# Patient Record
Sex: Female | Born: 1975 | Race: Black or African American | Hispanic: No | Marital: Married | Smoking: Never smoker
Health system: Southern US, Community
[De-identification: ages and names within clinical notes are randomized; demographics above are authoritative.]

## PROBLEM LIST (undated history)

## (undated) DIAGNOSIS — I10 Essential (primary) hypertension: Secondary | ICD-10-CM

## (undated) DIAGNOSIS — G43909 Migraine, unspecified, not intractable, without status migrainosus: Secondary | ICD-10-CM

## (undated) DIAGNOSIS — E119 Type 2 diabetes mellitus without complications: Secondary | ICD-10-CM

## (undated) DIAGNOSIS — F319 Bipolar disorder, unspecified: Secondary | ICD-10-CM

## (undated) DIAGNOSIS — D649 Anemia, unspecified: Secondary | ICD-10-CM

## (undated) HISTORY — PX: GASTRIC BYPASS: SHX52

---

## 2000-04-25 ENCOUNTER — Encounter: Admission: RE | Admit: 2000-04-25 | Discharge: 2000-07-24 | Payer: Self-pay | Admitting: Surgical Oncology

## 2002-01-23 ENCOUNTER — Emergency Department (HOSPITAL_COMMUNITY): Admission: EM | Admit: 2002-01-23 | Discharge: 2002-01-23 | Payer: Self-pay | Admitting: Emergency Medicine

## 2003-09-29 ENCOUNTER — Emergency Department (HOSPITAL_COMMUNITY): Admission: EM | Admit: 2003-09-29 | Discharge: 2003-09-29 | Payer: Self-pay | Admitting: Family Medicine

## 2004-04-08 ENCOUNTER — Emergency Department (HOSPITAL_COMMUNITY): Admission: EM | Admit: 2004-04-08 | Discharge: 2004-04-08 | Payer: Self-pay | Admitting: Family Medicine

## 2005-04-12 ENCOUNTER — Inpatient Hospital Stay (HOSPITAL_COMMUNITY): Admission: AD | Admit: 2005-04-12 | Discharge: 2005-04-12 | Payer: Self-pay | Admitting: Obstetrics and Gynecology

## 2005-04-20 ENCOUNTER — Other Ambulatory Visit: Admission: RE | Admit: 2005-04-20 | Discharge: 2005-04-20 | Payer: Self-pay | Admitting: Obstetrics and Gynecology

## 2005-04-21 ENCOUNTER — Other Ambulatory Visit: Admission: RE | Admit: 2005-04-21 | Discharge: 2005-04-21 | Payer: Self-pay | Admitting: Obstetrics and Gynecology

## 2005-07-13 ENCOUNTER — Ambulatory Visit (HOSPITAL_COMMUNITY): Admission: RE | Admit: 2005-07-13 | Discharge: 2005-07-13 | Payer: Self-pay | Admitting: Obstetrics and Gynecology

## 2005-09-06 ENCOUNTER — Ambulatory Visit: Payer: Self-pay | Admitting: Gynecology

## 2005-09-06 ENCOUNTER — Emergency Department (HOSPITAL_COMMUNITY): Admission: EM | Admit: 2005-09-06 | Discharge: 2005-09-06 | Payer: Self-pay | Admitting: Emergency Medicine

## 2005-09-07 ENCOUNTER — Inpatient Hospital Stay (HOSPITAL_COMMUNITY): Admission: RE | Admit: 2005-09-07 | Discharge: 2005-09-09 | Payer: Self-pay | Admitting: Psychiatry

## 2005-11-09 ENCOUNTER — Inpatient Hospital Stay (HOSPITAL_COMMUNITY): Admission: AD | Admit: 2005-11-09 | Discharge: 2005-11-09 | Payer: Self-pay | Admitting: Obstetrics and Gynecology

## 2005-11-10 ENCOUNTER — Observation Stay (HOSPITAL_COMMUNITY): Admission: AD | Admit: 2005-11-10 | Discharge: 2005-11-11 | Payer: Self-pay | Admitting: Obstetrics and Gynecology

## 2005-11-23 ENCOUNTER — Encounter (INDEPENDENT_AMBULATORY_CARE_PROVIDER_SITE_OTHER): Payer: Self-pay | Admitting: Specialist

## 2005-11-23 ENCOUNTER — Inpatient Hospital Stay (HOSPITAL_COMMUNITY): Admission: AD | Admit: 2005-11-23 | Discharge: 2005-11-26 | Payer: Self-pay | Admitting: Obstetrics and Gynecology

## 2005-11-29 ENCOUNTER — Inpatient Hospital Stay (HOSPITAL_COMMUNITY): Admission: AD | Admit: 2005-11-29 | Discharge: 2005-11-29 | Payer: Self-pay | Admitting: Obstetrics & Gynecology

## 2006-05-26 ENCOUNTER — Emergency Department (HOSPITAL_COMMUNITY): Admission: EM | Admit: 2006-05-26 | Discharge: 2006-05-26 | Payer: Self-pay | Admitting: Emergency Medicine

## 2006-11-01 ENCOUNTER — Emergency Department (HOSPITAL_COMMUNITY): Admission: EM | Admit: 2006-11-01 | Discharge: 2006-11-01 | Payer: Self-pay | Admitting: Family Medicine

## 2008-12-24 ENCOUNTER — Emergency Department (HOSPITAL_COMMUNITY): Admission: EM | Admit: 2008-12-24 | Discharge: 2008-12-24 | Payer: Self-pay | Admitting: Emergency Medicine

## 2010-01-26 ENCOUNTER — Emergency Department (HOSPITAL_COMMUNITY): Admission: EM | Admit: 2010-01-26 | Discharge: 2010-01-26 | Payer: Self-pay | Admitting: Emergency Medicine

## 2010-03-10 ENCOUNTER — Emergency Department (HOSPITAL_COMMUNITY): Admission: EM | Admit: 2010-03-10 | Discharge: 2010-03-10 | Payer: Self-pay | Admitting: Family Medicine

## 2010-04-06 ENCOUNTER — Emergency Department (HOSPITAL_COMMUNITY): Admission: EM | Admit: 2010-04-06 | Discharge: 2010-04-06 | Payer: Self-pay | Admitting: Family Medicine

## 2010-04-10 ENCOUNTER — Emergency Department (HOSPITAL_COMMUNITY): Admission: EM | Admit: 2010-04-10 | Discharge: 2010-04-10 | Payer: Self-pay | Admitting: Emergency Medicine

## 2010-10-21 NOTE — Discharge Summary (Signed)
Kathryn Middleton, Kathryn Middleton           ACCOUNT NO.:  [id number]   MEDICAL RECORD NO.:  [id number]          PATIENT TYPE:  IPS   LOCATION:  0505                          FACILITY:  [facility]   PHYSICIAN:  Geoffery Lyons, M.D.      DATE OF BIRTH:  [birth year]   DATE OF ADMISSION:  09/07/2005  DATE OF DISCHARGE:  09/09/2005                                 DISCHARGE SUMMARY   CHIEF COMPLAINT PRESENT ILLNESS:  This was the first admission to [facility] for this 35 year old African American female  __________ admitted.  Referred by OBG, presented in the emergency room with  confusion, some unresponsiveness, wbc 18.6 but no signs of infection.  Admitted to insomnia, taking one and a half tablets of Ambien daily and  possibly Benadryl.  When home was guarded, felt paranoid, was feeling that  someone would break into the house.  Had been taking one and a half Ambien  for sleep and Benadryl for allergies.   PAST PSYCHIATRIC HISTORY:  Has seen Valinda Hoar, N.P., and Areta Haber  first time inpatient.  Treated outpatient by Valinda Hoar, N.P., for  anxiety.  History of variable sleep and mood since the 20s.  No true mania.   ALCOHOL AND DRUG HISTORY:  Denies abuse of any substances.   PAST MEDICAL HISTORY:  1.  Seven and a half months pregnant.  2.  Gastric bypass 2002.  3.  Gestational diabetes.  4.  Cesarean section x1.   MEDICATIONS:  1.  Benadryl 25 one to two p.r.n. allergies.  2.  Celexa 40 mg per day.  3.  Hydrochlorothiazide 12.5 mg per day.  4.  Methyldopa oral three twice a day.   Physical examination performed and failed to show any acute findings other  than the stated pregnancy.   LABORATORY DATA:  CBC with wbc 19.2, hemoglobin 11.3.   MENTAL STATUS EXAM:  Stable __________ alert, cooperative female.  Mood  somewhat anxious.  Affect bright, broad.  Speech fluent, articulate, normal  rate and production.  Mood euthymic.  Affect broad.  Thought  process  logical, coherent and relevant.  No evidence of delusions.  No  hallucinations.  Cognition well preserved.   ADMISSION DIAGNOSES:  AXIS I:  Rule out organic brain syndrome secondary to  medications.  Mood disorder, not otherwise specified.  AXIS II:  No diagnosis.  AXIS III:  Intrauterine pregnancy, seven months' gestation.  Arterial  hypertension.  Leukocytosis.  AXIS IV:  Moderate.  AXIS V:  Upon admission 35, highest GAF in the last year 75.   HOSPITAL COURSE:  She was admitted.  She was started in individual and group  psychotherapy.  She was initially maintained on the Celexa 30 mg per day,  methyldopa 250 twice a day, Niferex 150 mg daily, hydrochlorothiazide 12.5  mg per day.  Celexa was dropped to 10 mg per day.  She was given some Ambien  for sleep.  She did endorse that she got overwhelmed, not being able to  sleep.  She took more Ambien than recommended, got very upset with herself,  endorsed some underlying  depression, isolating.  Has let go of the  activities that she use to like to do, church, husband being a Education officer, environmental, but  she said that she was not suicidal and that she was feeling well and that  she wanted to be discharged home.  As there were no safety concerns, she was  in full contact with reality, there was no evidence of suicidal or homicidal  ideations and most possibly the acute presentation was secondary to  medication, we went ahead and discharged to outpatient follow-up.   DISCHARGE DIAGNOSES:  AXIS I:  Depressive disorder, not otherwise specified.  Medication induced psychotic symptoms, resolved.  AXIS II:  No diagnosis.  AXIS III:  Intrauterine pregnancy.  Arterial hypertension.  Leukocytosis,  cause undetermined.  AXIS IV:  Moderate.  AXIS V:  Upon discharge 55 to 60.   DISCHARGE MEDICATIONS:  1.  __________ one tablet daily.  2.  Aldomet 250 twice a day.  3.  Nu-Iron 150 daily.  4.  Hydrochlorothiazide 25 mg per day.  5.  Celexa 20 one half  daily.  6.  Ambien 10 one half to one at night for sleep.   FOLLOW UP:  Miguel Aschoff, M.D., within a week.  They are going to coordinate  any further psychiatric follow-up.      Geoffery Lyons, M.D.  Electronically Signed     IL/MEDQ  D:  09/19/2005  T:  09/20/2005  Job:  [id number]

## 2010-10-21 NOTE — Discharge Summary (Signed)
NAMEVERONNICA, Kathryn Middleton           ACCOUNT NO.:  [id number]   MEDICAL RECORD NO.:  [id number]          PATIENT TYPE:  INP   LOCATION:  9171                          FACILITY:  [facility]   PHYSICIAN:  Miguel Aschoff, M.D.       DATE OF BIRTH:  1975/10/04   DATE OF ADMISSION:  11/10/2005  DATE OF DISCHARGE:  11/11/2005                                 DISCHARGE SUMMARY   FINAL DIAGNOSIS:  Intrauterine pregnancy at 36 weeks and 2 days, chronic  hypertension which is stable on Aldomet IV and insulin dependent gestational  diabetes mellitus and back pain with cholelithiasis.   COMPLICATIONS:  None.   This 35 year old G2 P1-0-0-1 presents at [redacted] weeks gestation complaining of  upper and lower back pain and some decreased fetal movement.  The patient  has also had some nausea and vomiting starting and questionable  contractions.  Her antepartum course at this point has been complicated by  chronic hypertension on which she was stable on Aldomet IV.  She has also  had a history of gastric bypass surgery.  She is also on some Celexa 40 mg  daily and insulin for her gestational diabetes mellitus.  The patient was  admitted at this time. Her cervix was still closed.  The fetal heart tones  were in the 160s to 180s.  She did have a reactive strep.  She was admitted  for observation.  She had a BPP on June 7 that was 8/10 with a gallbladder  ultrasound was ordered to check if this is her source of pain. It  that did  come back showing, showing cholelithiasis but no evidence of acute  cholecystitis.  She was sent home on home on her diabetic diet to continue  her medications she is taking currently, given Darvocet N 100 one to two  every 4 hours as needed for pain, was to follow up in our office on June 12,  of course to call with any increased pain or problems.   LABS ON DISCHARGE:  The patient had a hemoglobin of 11.5, white blood cell  count of 9.2, platelets of 289,000.  The patient has also had low  sodium and  calcium upon admission on the 8th but no abnormalities in her PIH panel.      Leilani Able, P.A.-C.      Miguel Aschoff, M.D.  Electronically Signed    MB/MEDQ  D:  01/01/2006  T:  01/01/2006  Job:  [id number]

## 2010-10-21 NOTE — Discharge Summary (Signed)
Kathryn Middleton, Kathryn Middleton           ACCOUNT NO.:  [id number]   MEDICAL RECORD NO.:  [id number]          PATIENT TYPE:  INP   LOCATION:                                [facility]  [facility]   PHYSICIAN:  Ilda Mori, M.D.   DATE OF BIRTH:  15-Jan-1976   DATE OF ADMISSION:  11/23/2005  DATE OF DISCHARGE:  11/26/2005                                 DISCHARGE SUMMARY   FINAL DIAGNOSES:  1.  Intrauterine pregnancy at [redacted] weeks gestation, breech presentation.  2.  Chronic hypertension.  3.  Insulin dependent gestational diabetes mellitus.  4.  Depression.   PROCEDURE:  Repeat low flap transverse cesarean section. Surgeon Dr. Miguel Aschoff. Assistant Dr. Lodema Hong.   COMPLICATIONS:  None.   This 35 year old G2, P1-0-0-1, presents at [redacted] weeks gestation for repeat  cesarean section. The patient had a prior cesarean section with her last  pregnancy and desires repeat with this pregnancy.  The patient also has a  known breech presentation. The patient's antepartum course has been  complicated by chronic hypertension. Her blood pressures have been stable on  Aldomet orally. The patient also has gestational diabetes which she has used  insulin for and has had pretty good blood sugar control. The patient also  noted to have a positive group B strep culture at [redacted] weeks gestation in our  office. The patient has also been previously hospitalized a week or two  prior for back pain which was recognized to be cholelithiasis. The patient  presents to [facility] on November 23, 2005 for a repeat low transverse  cesarean section performed by Dr. Miguel Aschoff. The patient had a delivery of  a 6 pound 6 ounce female infant with Apgars of 9 and 9. There was a breech  presentation upon delivery. Delivery went without complications. The  patient's postoperative course was benign without any significant fevers.  She was felt ready for discharge on postoperative day #3. She was sent home  on a regular diet, told to  decrease her activities. Told to get back on her  lisinopril for blood pressure 10 mg daily, to continue her Celexa 40 mg  daily for her depression. Could continue her Ambien 10 mg as needed to help  sleep. Was given Vicodin #30, one every 6 hours as needed for pain. Told she  could use Motrin up to 800 mg every 6 hours as needed for pain. To continue  her vitamins. Was to follow up in our office on June 26 for her staple  removal, and, of course, to call with any increased bleeding, pain or  problems.   LABORATORIES ON DISCHARGE:  The patient had a hemoglobin 9.3, white blood  cell count 14.9, platelets 285,000, and a normal PIH panel.     Leilani Able, P.A.-C.      Ilda Mori, M.D.  Electronically Signed   MB/MEDQ  D:  01/01/2006  T:  01/01/2006  Job:  [id number]

## 2010-10-21 NOTE — Op Note (Signed)
Kathryn Middleton, Kathryn Middleton           ACCOUNT NO.:  [id number]   MEDICAL RECORD NO.:  [id number]          PATIENT TYPE:  INP   LOCATION:  9131                          FACILITY:  [facility]   PHYSICIAN:  Miguel Aschoff, M.D.       DATE OF BIRTH:  [birth year]   DATE OF PROCEDURE:  11/23/2005  DATE OF DISCHARGE:                                 OPERATIVE REPORT   PREOPERATIVE DIAGNOSES:  1.  Intrauterine pregnancy at 38 weeks, for elective repeat cesarean      section.  2.  Breech presentation.   POSTOPERATIVE DIAGNOSES:  1.  Intrauterine pregnancy at 38 weeks, for elective repeat cesarean      section.  2.  Breech presentation.  3.  Delivery of a viable female infant, Apgar 9/9.   PROCEDURE:  Repeat low flap transverse cesarean section.   SURGEON:  Miguel Aschoff, M.D.   ASSISTANT:  Luvenia Redden, M.D.   ANESTHESIA:  Spinal.   COMPLICATIONS:  None.   JUSTIFICATION:  The patient is a 35 year old black female with an estimated  date of confinement of December 07, 2005.  The patient underwent previous  cesarean section.  The patient's pregnancy has been complicated by  hypertension and diabetes.  She is noted to have a breech presentation and  in view of the patient's additional medical problems, she is being taken to  the operating room at this time to undergo elective repeat cesarean section.   PROCEDURE:  The patient was taken to the operating room and placed in a  sitting position, where combined spinal and epidural anesthetic were placed  without difficulty.  After this was done a Foley catheter was inserted and  the patient was prepped and draped in the usual sterile fashion.  Her  previous Pfannenstiel incision was then reincised.  The incision was  extended down through subcutaneous tissue with bleeding points being  clamped, cut and coagulated as they were encountered.  The fascia was then  incised transversely and then separated from the underlying rectus muscles.  The rectus muscles were  divided in the midline.  The peritoneum was then  found and entered, carefully avoiding the underlying structures.  At this  point a bladder flap was created and protected with a bladder blade.  Then  elliptical incision was made into the lower uterine segment.  The amniotic  cavity was entered, clear fluid was obtained, and at this point the patient  was delivered of a viable female infant, Apgar 9 at one minute and 9 at five  minutes, from a double footling left sacrum anterior breech position.  The  baby was handed to the pediatric team in attendance.  After this was done  cord bloods were obtained for appropriate studies.  The uterus was then  evacuated of any remaining products of conception.  The angles of the  uterine incision were then ligated using figure-of-eight sutures of #1  Vicryl.  There was an extension on the left uterine incision that was  repaired without difficulty.  The uterus was then closed in layers.  The  first layer was a running interlocking  suture of #1 Vicryl, followed by an  imbricating suture of #1 Vicryl.  The bladder flap was then reapproximated  using running continuous 2-0 Vicryl sutures.  At this point lap counts and  instrument counts were taken and found to be correct, and then the abdomen  was closed.  The parietal peritoneum was closed using running continuous 0  Vicryl suture.  Rectus muscles were reapproximated using running continuous  0 Vicryl sutures.  Fascia was then closed using 2 sutures of 0 Vicryl, each  starting at the lateral fascial angles and then meeting in the midline.  The  subcutaneous tissue was closed using interrupted 0 Vicryl sutures and the  skin incision was closed using staples.  The estimated blood loss from the  procedure was approximately 600 mL.  The patient tolerated the procedure  well and went to the recovery room in satisfactory condition.      Miguel Aschoff, M.D.  Electronically Signed     AR/MEDQ  D:  11/23/2005   T:  11/24/2005  Job:  [id number]

## 2011-10-05 ENCOUNTER — Emergency Department (INDEPENDENT_AMBULATORY_CARE_PROVIDER_SITE_OTHER)
Admission: EM | Admit: 2011-10-05 | Discharge: 2011-10-05 | Disposition: A | Discharge status: Home / Self Care | Payer: Self-pay | Source: Home / Self Care | Attending: Family Medicine | Admitting: Family Medicine

## 2011-10-05 ENCOUNTER — Encounter (HOSPITAL_COMMUNITY): Payer: Self-pay | Admitting: Emergency Medicine

## 2011-10-05 DIAGNOSIS — J069 Acute upper respiratory infection, unspecified: Secondary | ICD-10-CM

## 2011-10-05 HISTORY — DX: Essential (primary) hypertension: I10

## 2011-10-05 MED ORDER — IBUPROFEN 800 MG PO TABS: 800.0000 mg | ORAL_TABLET | Freq: Three times a day (TID) | ORAL | Status: AC

## 2011-10-05 MED ORDER — GUAIFENESIN-CODEINE 100-10 MG/5ML PO SYRP: ORAL_SOLUTION | ORAL | Status: DC

## 2011-10-05 MED ORDER — AMOXICILLIN 500 MG PO CAPS: 500.0000 mg | ORAL_CAPSULE | Freq: Three times a day (TID) | ORAL | Status: AC

## 2011-10-05 NOTE — ED Notes (Signed)
Great discussion about medicines.  Patient requesting ibuprofen 800mg  script, contacted dr Lorenza Chick

## 2011-10-05 NOTE — ED Notes (Signed)
Throat pain with coughing.  C/o congestion.  "if i just could get it up".  Reports coughing" fits ".  Cough worse at night.  Reports fever 101.

## 2011-10-05 NOTE — ED Provider Notes (Signed)
History     CSN: [id number]  Arrival date & time 10/05/11  0903   First MD Initiated Contact with Patient 10/05/11 [phone]      Chief Complaint  Patient presents with  . Sore Throat    (Consider location/radiation/quality/duration/timing/severity/associated sxs/prior treatment) HPI Comments: The patient reports having cough and sore throat x 2-3 days. Fever to 101 2 days ago. Taking otc meds with minimal relief. Coughing causing her chest to hurt. No ear pain. Some runny nose.   The history is provided by the patient.    Past Medical History  Diagnosis Date  . Hypertension     Past Surgical History  Procedure Date  . Cesarean section     History reviewed. No pertinent family history.  History  Substance Use Topics  . Smoking status: Never Smoker   . Smokeless tobacco: Not on file  . Alcohol Use: No    OB History    Grav Para Term Preterm Abortions TAB SAB Ect Mult Living                  Review of Systems  Constitutional: Positive for fever and chills.  HENT: Positive for congestion, sore throat, rhinorrhea and postnasal drip. Negative for ear pain.   Respiratory: Positive for cough. Negative for wheezing.   Cardiovascular: Negative.   Gastrointestinal: Negative.   Musculoskeletal: Positive for arthralgias.  Skin: Negative.     Allergies  Review of patient's allergies indicates no known allergies.  Home Medications   Current Outpatient Rx  Name Route Sig Dispense Refill  . LISINOPRIL-HYDROCHLOROTHIAZIDE 20-25 MG PO TABS Oral Take 1 tablet by mouth daily.    Marland Kitchen OVER THE COUNTER MEDICATION  Delsym, robitussin, allergy/sinus medicine      BP 116/82  Pulse 110  Temp(Src) 98.5 F (36.9 C) (Oral)  Resp 18  SpO2 95%  Physical Exam  Nursing note and vitals reviewed. Constitutional: She appears well-developed and well-nourished. No distress.  HENT:  Head: Normocephalic.       Ears clear, nose congested, throat mild erythema.   Neck: Normal range of  motion. Neck supple.  Cardiovascular: Normal rate and regular rhythm.   Pulmonary/Chest: Effort normal and breath sounds normal.       Congested croupy cough  Lymphadenopathy:    She has no cervical adenopathy.  Skin: Skin is warm and dry.    ED Course  Procedures (including critical care time)  Labs Reviewed - No data to display No results found.   1. URI (upper respiratory infection)       MDM          Randa Spike, MD 10/05/11 1100

## 2011-10-05 NOTE — Discharge Instructions (Signed)
Tylenol or motrin as needed. Void caffeine and milk products. Follow up with your pcp or return if symptoms persist or worsen.

## 2011-12-20 ENCOUNTER — Emergency Department (HOSPITAL_COMMUNITY)
Admission: EM | Admit: 2011-12-20 | Discharge: 2011-12-20 | Disposition: A | Discharge status: Home / Self Care | Payer: Self-pay | Attending: Emergency Medicine | Admitting: Emergency Medicine

## 2011-12-20 ENCOUNTER — Encounter (HOSPITAL_COMMUNITY): Payer: Self-pay

## 2011-12-20 DIAGNOSIS — I10 Essential (primary) hypertension: Secondary | ICD-10-CM | POA: Insufficient documentation

## 2011-12-20 DIAGNOSIS — K029 Dental caries, unspecified: Secondary | ICD-10-CM | POA: Insufficient documentation

## 2011-12-20 DIAGNOSIS — K0889 Other specified disorders of teeth and supporting structures: Secondary | ICD-10-CM

## 2011-12-20 MED ORDER — IBUPROFEN 800 MG PO TABS: 800.0000 mg | ORAL_TABLET | Freq: Three times a day (TID) | ORAL | Status: AC

## 2011-12-20 MED ORDER — PENICILLIN V POTASSIUM 500 MG PO TABS: 500.0000 mg | ORAL_TABLET | Freq: Three times a day (TID) | ORAL | Status: AC

## 2011-12-20 MED ORDER — ACETAMINOPHEN-CODEINE #3 300-30 MG PO TABS: 1.0000 | ORAL_TABLET | Freq: Four times a day (QID) | ORAL | Status: AC | PRN

## 2011-12-20 NOTE — ED Notes (Signed)
Patient reports that she has dental pain right upper tooth x 2 weeks. Patient denies swelling of area.

## 2011-12-20 NOTE — ED Provider Notes (Signed)
History     CSN: [id number]  Arrival date & time 12/20/11  1032   First MD Initiated Contact with Patient 12/20/11 1117      Chief Complaint  Patient presents with  . Dental Pain    (Consider location/radiation/quality/duration/timing/severity/associated sxs/prior treatment) Patient is a 36 y.o. female presenting with tooth pain. The history is provided by the patient.  Dental Pain  Patient presents to emergency department complaining of right upper dental pain x2 weeks with associated dental decay. Patient states pain was gradual onset but has been persistent and worsening despite using over-the-counter pain medications as well as topical Orajel. Patient states the pain is associated with dental decay stating that she can "feel a hole in my tooth with my tongue" patient states that she has not had dental followup and a very long time due to financial difficulties. Patient denies fevers, chills, facial swelling, difficulty breathing or swallowing. Patient is pain is aggravated by chewing and cold air. She denies alleviating factors. Past Medical History  Diagnosis Date  . Hypertension     Past Surgical History  Procedure Date  . Cesarean section     No family history on file.  History  Substance Use Topics  . Smoking status: Never Smoker   . Smokeless tobacco: Not on file  . Alcohol Use: No    OB History    Grav Para Term Preterm Abortions TAB SAB Ect Mult Living                  Review of Systems  All other systems reviewed and are negative.    Allergies  Review of patient's allergies indicates no known allergies.  Home Medications   Current Outpatient Rx  Name Route Sig Dispense Refill  . ASPIRIN-ACETAMINOPHEN-CAFFEINE 250-250-65 MG PO TABS Oral Take 2 tablets by mouth every 6 (six) hours as needed. For migraine    . BENZOCAINE 10 % MT GEL Mouth/Throat Use as directed 1 application in the mouth or throat 3 (three) times daily as needed. For pain    .  LISINOPRIL-HYDROCHLOROTHIAZIDE 20-25 MG PO TABS Oral Take 1 tablet by mouth daily.    Marland Kitchen OXYMETAZOLINE HCL 0.05 % NA SOLN Nasal Place 3 sprays into the nose daily as needed. For sinus pressure    . ACETAMINOPHEN-CODEINE #3 300-30 MG PO TABS Oral Take 1-2 tablets by mouth every 6 (six) hours as needed for pain. 20 tablet 0  . IBUPROFEN 800 MG PO TABS Oral Take 1 tablet (800 mg total) by mouth 3 (three) times daily. Take 800mg  by mouth at breakfast, lunch and dinner for the next 5 days 21 tablet 0  . PENICILLIN V POTASSIUM 500 MG PO TABS Oral Take 1 tablet (500 mg total) by mouth 3 (three) times daily. 30 tablet 0    BP 90/68  Pulse 75  Temp 98.6 F (37 C)  Resp 20  SpO2 97%  LMP 12/13/2011  Physical Exam  Nursing note and vitals reviewed. Constitutional: She is oriented to person, place, and time. She appears well-developed and well-nourished.  HENT:  Head: Normocephalic and atraumatic.       Massive dental decay of right upper molar with TTP of surrounding gingiva but no gingival mass or fluctuance.   Eyes: Conjunctivae are normal.  Neck: Normal range of motion. Neck supple.  Cardiovascular: Normal rate and regular rhythm.   Pulmonary/Chest: Effort normal and breath sounds normal.  Musculoskeletal: Normal range of motion.  Lymphadenopathy:    She has no  cervical adenopathy.  Neurological: She is alert and oriented to person, place, and time.  Skin: Skin is warm and dry.  Psychiatric: She has a normal mood and affect. Her behavior is normal.    ED Course  Procedures (including critical care time)  Patient is driving. Happy with prescriptions for pain control.   Labs Reviewed - No data to display No results found.   1. Pain, dental   2. Dental caries       MDM  Dental pain associated with dental cary but no signs or symptoms of dental abscess with patient afebrile, non toxic appearing and swallowing secretions well. I gave patient referral to dentist and stressed the  importance of dental follow up for ultimate management of dental pain. Patient voices understanding and is agreeable to plan.         [place], [place] 12/20/11 1229

## 2011-12-21 NOTE — ED Provider Notes (Signed)
Medical screening examination/treatment/procedure(s) were performed by non-physician practitioner and as supervising physician I was immediately available for consultation/collaboration.   Kairyn Olmeda R Celinda Dethlefs, MD 12/21/11 0749 

## 2012-06-09 ENCOUNTER — Encounter (HOSPITAL_COMMUNITY): Payer: Self-pay | Admitting: Emergency Medicine

## 2012-06-09 ENCOUNTER — Emergency Department (HOSPITAL_COMMUNITY)
Admission: EM | Admit: 2012-06-09 | Discharge: 2012-06-09 | Disposition: A | Discharge status: Home / Self Care | Payer: Self-pay | Source: Home / Self Care

## 2012-06-09 ENCOUNTER — Emergency Department (INDEPENDENT_AMBULATORY_CARE_PROVIDER_SITE_OTHER): Payer: Self-pay

## 2012-06-09 DIAGNOSIS — J069 Acute upper respiratory infection, unspecified: Secondary | ICD-10-CM

## 2012-06-09 MED ORDER — AZITHROMYCIN 250 MG PO TABS: ORAL_TABLET | ORAL | Status: DC

## 2012-06-09 MED ORDER — GUAIFENESIN-CODEINE 100-10 MG/5ML PO SYRP: 5.0000 mL | ORAL_SOLUTION | Freq: Three times a day (TID) | ORAL | Status: DC | PRN

## 2012-06-09 NOTE — ED Notes (Signed)
Waiting discharge papers 

## 2012-06-09 NOTE — ED Notes (Signed)
Pt c/o productive cough x 2 to three days. Sob. Headache. Fever. Pt has tried otc meds with no relief of symptoms. Pt denies vomiting and diarrhea. Some nausea.

## 2012-06-09 NOTE — ED Provider Notes (Signed)
Medical screening examination/treatment/procedure(s) were performed by non-physician practitioner and as supervising physician I was immediately available for consultation/collaboration.  Raynald Blend, MD 06/09/12 1745

## 2012-06-09 NOTE — ED Provider Notes (Addendum)
Kathryn Middleton is a 37 y.o. female who presents to Urgent Care today for cough, and mild fever present for 3 days. Patient notes trouble sleeping at night secondary to cough. She denies any shortness of breath chest pain or palpitation. She denies any chills. She has tried over-the-counter medications which are only marginally helpful. She feels well otherwise with no nausea vomiting or diarrhea. She has positive sick contacts.   PMH reviewed. Hypertension, and obesity and anxiety History  Substance Use Topics  . Smoking status: Never Smoker   . Smokeless tobacco: Not on file  . Alcohol Use: No   ROS as above Medications reviewed. No current facility-administered medications for this encounter.   Current Outpatient Prescriptions  Medication Sig Dispense Refill  . BuPROPion HCl (WELLBUTRIN PO) Take by mouth.      Marland Kitchen lisinopril-hydrochlorothiazide (PRINZIDE,ZESTORETIC) 20-25 MG per tablet Take 1 tablet by mouth daily.      Marland Kitchen aspirin-acetaminophen-caffeine (EXCEDRIN MIGRAINE) 250-250-65 MG per tablet Take 2 tablets by mouth every 6 (six) hours as needed. For migraine      . benzocaine (ORAJEL) 10 % mucosal gel Use as directed 1 application in the mouth or throat 3 (three) times daily as needed. For pain      . oxymetazoline (AFRIN) 0.05 % nasal spray Place 3 sprays into the nose daily as needed. For sinus pressure        Exam:  BP 137/72  Pulse 116  Temp 98.3 F (36.8 C) (Oral)  Resp 20  SpO2 96% Gen: Well NAD, nontoxic appearing HEENT: EOMI,  MMM Lungs: CTABL Nl WOB Heart: RRR no MRG Abd: NABS, NT, ND Exts: Non edematous BL  LE, warm and well perfused.   No results found for this or any previous visit (from the past 24 hour(s)). Dg Chest 2 View  06/09/2012  *RADIOLOGY REPORT*  Clinical Data: Tachycardia.  Hypoxemia.  CHEST - 2 VIEW  Comparison: Two-view chest x-ray 11/01/2006.  Findings: Cardiomediastinal silhouette unremarkable, unchanged. Lungs clear.  Bronchovascular markings  normal.  Pulmonary vascularity normal.  No pneumothorax.  No pleural effusions. Visualized bony thorax intact.  IMPRESSION: Normal examination.   Original Report Authenticated By: Hulan Saas, M.D.     Assessment and Plan: 37 y.o. female with viral URI with cough. Plan to treat symptomatically with codeine containing cough medication, and Tylenol. Discussed warning signs or symptoms. Please see discharge instructions. Patient expresses understanding. F/u PRN  We'll prescribe azithromycin to fill on Wednesday if patient is not feeling better.    Rodolph Bong, MD 06/09/12 1721  Rodolph Bong, MD 06/09/12 [phone]

## 2012-06-09 NOTE — ED Provider Notes (Signed)
Medical screening examination/treatment/procedure(s) were performed by non-physician practitioner and as supervising physician I was immediately available for consultation/collaboration.  Raynald Blend, MD 06/09/12 [phone]

## 2013-08-08 ENCOUNTER — Encounter (HOSPITAL_COMMUNITY): Payer: Self-pay | Admitting: Emergency Medicine

## 2013-08-08 ENCOUNTER — Emergency Department (HOSPITAL_COMMUNITY)
Admission: EM | Admit: 2013-08-08 | Discharge: 2013-08-08 | Disposition: A | Discharge status: Home / Self Care | Payer: BC Managed Care – PPO | Source: Home / Self Care | Attending: Family Medicine | Admitting: Family Medicine

## 2013-08-08 ENCOUNTER — Emergency Department (INDEPENDENT_AMBULATORY_CARE_PROVIDER_SITE_OTHER): Payer: BC Managed Care – PPO

## 2013-08-08 DIAGNOSIS — R6889 Other general symptoms and signs: Secondary | ICD-10-CM

## 2013-08-08 MED ORDER — KETOROLAC TROMETHAMINE 60 MG/2ML IM SOLN
INTRAMUSCULAR | Status: AC
  Filled 2013-08-08: qty 2

## 2013-08-08 MED ORDER — KETOROLAC TROMETHAMINE 60 MG/2ML IM SOLN
60.0000 mg | Freq: Once | INTRAMUSCULAR | Status: AC
  Administered 2013-08-08: 60 mg via INTRAMUSCULAR

## 2013-08-08 MED ORDER — OSELTAMIVIR PHOSPHATE 75 MG PO CAPS: 75.0000 mg | ORAL_CAPSULE | Freq: Two times a day (BID) | ORAL | Status: DC

## 2013-08-08 NOTE — ED Notes (Signed)
Patient complains of cough and chest congestion; states headache from coughing so much, some head congestion; denies nausea/vomitin, diarrhea.

## 2013-08-08 NOTE — ED Provider Notes (Signed)
Medical screening examination/treatment/procedure(s) were performed by resident physician or non-physician practitioner and as supervising physician I was immediately available for consultation/collaboration.   Jordon Kristiansen DOUGLAS MD.   Yazeed Pryer D Grissel Tyrell, MD 08/08/13 1709 

## 2013-08-08 NOTE — Discharge Instructions (Signed)

## 2013-08-08 NOTE — ED Provider Notes (Signed)
CSN: [id number]     Arrival date & time 08/08/13  1200 History   First MD Initiated Contact with Patient 08/08/13 1233     Chief Complaint  Patient presents with  . URI    Patient is a 38 y.o. female presenting with flu symptoms. The history is provided by the patient.  Influenza Presenting symptoms: cough, fatigue, fever, headache, myalgias and sore throat   Presenting symptoms: no diarrhea, no nausea, no rhinorrhea, no shortness of breath and no vomiting   Severity:  Moderate Duration:  36 hours Progression:  Worsening Chronicity:  New Relieved by:  Nothing Ineffective treatments:  OTC medications Associated symptoms: chills   Associated symptoms: no ear pain, no mental status change, no congestion and no neck stiffness   Risk factors: not elderly, no diabetes problem, no heart disease, no immunocompromised state, no kidney disease, no liver disease, not pregnant and no sick contacts   Onset of bodyaches , chills, subjective fever and harsh persistent cough Wednesday night That has worsened. Cough is non-productive but so harsh that it makes her head hurt and now w/ sore throat d/t irritation form cough. No relief w/ mx OTC meds. Pt states she did receive a flu shot this year.  Past Medical History  Diagnosis Date  . Hypertension    Past Surgical History  Procedure Laterality Date  . Cesarean section    . Cesarean section     No family history on file. History  Substance Use Topics  . Smoking status: Never Smoker   . Smokeless tobacco: Not on file  . Alcohol Use: No   OB History   Grav Para Term Preterm Abortions TAB SAB Ect Mult Living                 Review of Systems  Constitutional: Positive for fever, chills and fatigue.  HENT: Positive for sore throat. Negative for congestion, ear pain, rhinorrhea, trouble swallowing and voice change.   Eyes: Negative.   Respiratory: Positive for cough. Negative for shortness of breath.   Cardiovascular: Negative.  Negative for  chest pain, palpitations and leg swelling.  Gastrointestinal: Negative for nausea, vomiting, abdominal pain and diarrhea.  Endocrine: Negative.   Genitourinary: Negative.   Musculoskeletal: Positive for myalgias. Negative for neck stiffness.  Skin: Negative.   Allergic/Immunologic: Negative.   Neurological: Positive for headaches.  Hematological: Negative.   Psychiatric/Behavioral: Negative.     Allergies  Review of patient's allergies indicates no known allergies.  Home Medications   Current Outpatient Rx  Name  Route  Sig  Dispense  Refill  . BuPROPion HCl (WELLBUTRIN PO)   Oral   Take by mouth.         Marland Kitchen. lisinopril-hydrochlorothiazide (PRINZIDE,ZESTORETIC) 20-25 MG per tablet   Oral   Take 1 tablet by mouth daily.         . medroxyPROGESTERone (DEPO-SUBQ PROVERA) 104 MG/0.65ML injection   Subcutaneous   Inject 104 mg into the skin every 3 (three) months.         Marland Kitchen. aspirin-acetaminophen-caffeine (EXCEDRIN MIGRAINE) 250-250-65 MG per tablet   Oral   Take 2 tablets by mouth every 6 (six) hours as needed. For migraine         . azithromycin (ZITHROMAX) 250 MG tablet      2 po day 1  1 po days 2-5   6 each   0   . benzocaine (ORAJEL) 10 % mucosal gel   Mouth/Throat   Use as directed 1  application in the mouth or throat 3 (three) times daily as needed. For pain         . guaiFENesin-codeine (ROBITUSSIN AC) 100-10 MG/5ML syrup   Oral   Take 5 mLs by mouth 3 (three) times daily as needed for cough.   120 mL   0   . oseltamivir (TAMIFLU) 75 MG capsule   Oral   Take 1 capsule (75 mg total) by mouth every 12 (twelve) hours.   10 capsule   0   . oxymetazoline (AFRIN) 0.05 % nasal spray   Nasal   Place 3 sprays into the nose daily as needed. For sinus pressure          BP 109/73  Pulse 104  Temp(Src) 98 F (36.7 C) (Oral)  Resp 16  SpO2 100% Physical Exam  Nursing note and vitals reviewed. Constitutional: She is oriented to person, place, and  time. She appears well-developed and well-nourished.  Non-toxic appearance. She has a sickly appearance. She does not appear ill. No distress.  HENT:  Head: Normocephalic and atraumatic.  Eyes: Conjunctivae are normal.  Neck: Neck supple.  Cardiovascular: Regular rhythm.   Mild tachycardia   Pulmonary/Chest: Effort normal and breath sounds normal.  Neurological: She is alert and oriented to person, place, and time.  Skin: Skin is warm and dry.  Psychiatric: She has a normal mood and affect.    ED Course  Procedures (including critical care time) Labs Review Labs Reviewed - No data to display Imaging Review Dg Chest 2 View  08/08/2013   CLINICAL DATA:  Cough, congestion.  EXAM: CHEST  2 VIEW  COMPARISON:  DG CHEST 2 VIEW dated 06/09/2012  FINDINGS: The heart size and mediastinal contours are within normal limits. Both lungs are clear. The visualized skeletal structures are unremarkable.  IMPRESSION: No active cardiopulmonary disease.   Electronically Signed   By: Charlett Nose M.D.   On: 08/08/2013 13:10     MDM   1. Flu-like symptoms    < 48 hrs since onset of flu like sx's. Will treat w/ Tamiflu. Home care discussed and provided in writing.    Leanne Chang, NP 08/08/13 [phone]

## 2013-11-01 ENCOUNTER — Encounter (HOSPITAL_COMMUNITY): Payer: Self-pay | Admitting: Emergency Medicine

## 2013-11-01 ENCOUNTER — Emergency Department (HOSPITAL_COMMUNITY)
Admission: EM | Admit: 2013-11-01 | Discharge: 2013-11-01 | Disposition: A | Discharge status: Home / Self Care | Payer: BC Managed Care – PPO | Source: Home / Self Care | Attending: Family Medicine | Admitting: Family Medicine

## 2013-11-01 DIAGNOSIS — G43909 Migraine, unspecified, not intractable, without status migrainosus: Secondary | ICD-10-CM

## 2013-11-01 DIAGNOSIS — Z733 Stress, not elsewhere classified: Secondary | ICD-10-CM

## 2013-11-01 DIAGNOSIS — F439 Reaction to severe stress, unspecified: Secondary | ICD-10-CM

## 2013-11-01 MED ORDER — SUMATRIPTAN SUCCINATE 100 MG PO TABS: 100.0000 mg | ORAL_TABLET | ORAL | Status: DC | PRN

## 2013-11-01 MED ORDER — KETOROLAC TROMETHAMINE 60 MG/2ML IM SOLN
60.0000 mg | Freq: Once | INTRAMUSCULAR | Status: AC
  Administered 2013-11-01: 60 mg via INTRAMUSCULAR

## 2013-11-01 MED ORDER — KETOROLAC TROMETHAMINE 60 MG/2ML IM SOLN
INTRAMUSCULAR | Status: AC
  Filled 2013-11-01: qty 2

## 2013-11-01 NOTE — ED Notes (Signed)
C/o migraine headache. On set last night. Hx of migraines.  States throbbing pressure felt over right eye.  Dizzy.  Nausea.  Nose bleed, last night.  Vomiting.  Sinus pressure and pain.

## 2013-11-01 NOTE — Discharge Instructions (Signed)
Migraine Headache A migraine headache is an intense, throbbing pain on one or both sides of your head. A migraine can last for 30 minutes to several hours. CAUSES  The exact cause of a migraine headache is not always known. However, a migraine may be caused when nerves in the brain become irritated and release chemicals that cause inflammation. This causes pain. Certain things may also trigger migraines, such as:  Alcohol.  Smoking.  Stress.  Menstruation.  Aged cheeses.  Foods or drinks that contain nitrates, glutamate, aspartame, or tyramine.  Lack of sleep.  Chocolate.  Caffeine.  Hunger.  Physical exertion.  Fatigue.  Medicines used to treat chest pain (nitroglycerine), birth control pills, estrogen, and some blood pressure medicines. SIGNS AND SYMPTOMS  Pain on one or both sides of your head.  Pulsating or throbbing pain.  Severe pain that prevents daily activities.  Pain that is aggravated by any physical activity.  Nausea, vomiting, or both.  Dizziness.  Pain with exposure to bright lights, loud noises, or activity.  General sensitivity to bright lights, loud noises, or smells. Before you get a migraine, you may get warning signs that a migraine is coming (aura). An aura may include:  Seeing flashing lights.  Seeing bright spots, halos, or zig-zag lines.  Having tunnel vision or blurred vision.  Having feelings of numbness or tingling.  Having trouble talking.  Having muscle weakness. DIAGNOSIS  A migraine headache is often diagnosed based on:  Symptoms.  Physical exam.  A CT scan or MRI of your head. These imaging tests cannot diagnose migraines, but they can help rule out other causes of headaches. TREATMENT Medicines may be given for pain and nausea. Medicines can also be given to help prevent recurrent migraines.  HOME CARE INSTRUCTIONS  Only take over-the-counter or prescription medicines for pain or discomfort as directed by your  health care provider. The use of long-term narcotics is not recommended.  Lie down in a dark, quiet room when you have a migraine.  Keep a journal to find out what may trigger your migraine headaches. For example, write down:  What you eat and drink.  How much sleep you get.  Any change to your diet or medicines.  Limit alcohol consumption.  Quit smoking if you smoke.  Get 7 9 hours of sleep, or as recommended by your health care provider.  Limit stress.  Keep lights dim if bright lights bother you and make your migraines worse. SEEK IMMEDIATE MEDICAL CARE IF:   Your migraine becomes severe.  You have a fever.  You have a stiff neck.  You have vision loss.  You have muscular weakness or loss of muscle control.  You start losing your balance or have trouble walking.  You feel faint or pass out.  You have severe symptoms that are different from your first symptoms. MAKE SURE YOU:   Understand these instructions.  Will watch your condition.  Will get help right away if you are not doing well or get worse. Document Released: 05/22/2005 Document Revised: 03/12/2013 Document Reviewed: 01/27/2013 [facility] Patient Information 2014 [place], [place].  Stress Management Stress is a state of physical or mental tension that often results from changes in your life or normal routine. Some common causes of stress are:  Death of a loved one.  Injuries or severe illnesses.  Getting fired or changing jobs.  Moving into a new home. Other causes may be:  Sexual problems.  Business or financial losses.  Taking on a  large debt.  Regular conflict with someone at home or at work.  Constant tiredness from lack of sleep. It is not just bad things that are stressful. It may be stressful to:  Win the lottery.  Get married.  Buy a new car. The amount of stress that can be easily tolerated varies from person to person. Changes generally cause stress, regardless of the  types of change. Too much stress can affect your health. It may lead to physical or emotional problems. Too little stress (boredom) may also become stressful. SUGGESTIONS TO REDUCE STRESS:  Talk things over with your family and friends. It often is helpful to share your concerns and worries. If you feel your problem is serious, you may want to get help from a professional counselor.  Consider your problems one at a time instead of lumping them all together. Trying to take care of everything at once may seem impossible. List all the things you need to do and then start with the most important one. Set a goal to accomplish 2 or 3 things each day. If you expect to do too many in a single day you will naturally fail, causing you to feel even more stressed.  Do not use alcohol or drugs to relieve stress. Although you may feel better for a short time, they do not remove the problems that caused the stress. They can also be habit forming.  Exercise regularly - at least 3 times per week. Physical exercise can help to relieve that "uptight" feeling and will relax you.  The shortest distance between despair and hope is often a good night's sleep.  Go to bed and get up on time allowing yourself time for appointments without being rushed.  Take a short "time-out" period from any stressful situation that occurs during the day. Close your eyes and take some deep breaths. Starting with the muscles in your face, tense them, hold it for a few seconds, then relax. Repeat this with the muscles in your neck, shoulders, hand, stomach, back and legs.  Take good care of yourself. Eat a balanced diet and get plenty of rest.  Schedule time for having fun. Take a break from your daily routine to relax. HOME CARE INSTRUCTIONS   Call if you feel overwhelmed by your problems and feel you can no longer manage them on your own.  Return immediately if you feel like hurting yourself or someone else. Document Released:  11/15/2000 Document Revised: 08/14/2011 Document Reviewed: 01/14/2013 [facility] Patient Information 2014 [place], [place].   Use as needed at the start of a headache if meds such as Ibuprofen are not helpful.

## 2013-11-01 NOTE — ED Provider Notes (Signed)
CSN: [id number]     Arrival date & time 11/01/13  1550 History   First MD Initiated Contact with Patient 11/01/13 [phone]     Chief Complaint  Patient presents with  . Migraine   (Consider location/radiation/quality/duration/timing/severity/associated sxs/prior Treatment) HPI Comments: [name] is a 38 yo black female with a known history of migraines who present with a "migraine" headache for 2 days. Pain along both temporals. Under "alot of stress" at work. Photosensitive. Mild nausea. No vomiting. No change in vision or weakness. See ROS.  Patient is a 38 y.o. female presenting with migraines. The history is provided by the patient.  Migraine Associated symptoms include headaches.    Past Medical History  Diagnosis Date  . Hypertension    Past Surgical History  Procedure Laterality Date  . Cesarean section    . Cesarean section     History reviewed. No pertinent family history. History  Substance Use Topics  . Smoking status: Never Smoker   . Smokeless tobacco: Not on file  . Alcohol Use: No   OB History   Grav Para Term Preterm Abortions TAB SAB Ect Mult Living                 Review of Systems  Constitutional: Negative for fever, chills and fatigue.  HENT: Positive for congestion and sinus pressure.   Eyes: Negative.   Respiratory: Negative.   Musculoskeletal: Negative.   Skin: Negative.   Allergic/Immunologic: Negative.   Neurological: Positive for headaches. Negative for seizures, speech difficulty, weakness, light-headedness and numbness.  Hematological: Negative.   Psychiatric/Behavioral: Positive for sleep disturbance. Negative for confusion.    Allergies  Review of patient's allergies indicates no known allergies.  Home Medications   Prior to Admission medications   Medication Sig Start Date End Date Taking? Authorizing Provider  BuPROPion HCl (WELLBUTRIN PO) Take by mouth.   Yes Historical Provider, MD  lisinopril-hydrochlorothiazide  (PRINZIDE,ZESTORETIC) 20-25 MG per tablet Take 1 tablet by mouth daily.   Yes Historical Provider, MD  aspirin-acetaminophen-caffeine (EXCEDRIN MIGRAINE) 410-306-3598250-250-65 MG per tablet Take 2 tablets by mouth every 6 (six) hours as needed. For migraine    Historical Provider, MD  azithromycin (ZITHROMAX) 250 MG tablet 2 po day 1  1 po days 2-5 06/09/12   Rodolph BongEvan S Corey, MD  benzocaine (ORAJEL) 10 % mucosal gel Use as directed 1 application in the mouth or throat 3 (three) times daily as needed. For pain    Historical Provider, MD  guaiFENesin-codeine (ROBITUSSIN AC) 100-10 MG/5ML syrup Take 5 mLs by mouth 3 (three) times daily as needed for cough. 06/09/12   Rodolph BongEvan S Corey, MD  medroxyPROGESTERone (DEPO-SUBQ PROVERA) 104 MG/0.65ML injection Inject 104 mg into the skin every 3 (three) months.    Historical Provider, MD  oseltamivir (TAMIFLU) 75 MG capsule Take 1 capsule (75 mg total) by mouth every 12 (twelve) hours. 08/08/13   Roma KayserKatherine P Schorr, NP  oxymetazoline (AFRIN) 0.05 % nasal spray Place 3 sprays into the nose daily as needed. For sinus pressure    Historical Provider, MD  SUMAtriptan (IMITREX) 100 MG tablet Take 1 tablet (100 mg total) by mouth every 2 (two) hours as needed for migraine or headache. May repeat in 2 hours if headache persists or recurs. 11/01/13   Riki SheerMichelle G Young, PA-C   BP 94/65  Pulse 120  Temp(Src) 98.4 F (36.9 C) (Oral)  Resp 16  SpO2 96% Physical Exam  Vitals reviewed. Constitutional: She is oriented to person, place, and  time. She appears well-developed and well-nourished. No distress.  HENT:  Head: Normocephalic and atraumatic.  Eyes: Conjunctivae are normal. Pupils are equal, round, and reactive to light. Right eye exhibits no discharge. Left eye exhibits no discharge. No scleral icterus.  Neck: Normal range of motion. Neck supple.  Cardiovascular: Normal rate and regular rhythm.   Pulmonary/Chest: Effort normal and breath sounds normal.  Lymphadenopathy:    She has no  cervical adenopathy.  Neurological: She is alert and oriented to person, place, and time. No cranial nerve deficit.  Skin: Skin is warm and dry. She is not diaphoretic.  Psychiatric: Her behavior is normal.    ED Course  Procedures (including critical care time) Labs Review Labs Reviewed - No data to display  Imaging Review No results found.   MDM   1. Migraine   2. Stress    1. Treated with Toradol as this has worked in the past. Stress reduction is key. RX for Imitrex as needed. Work note also given as requested.  2. Stress management is key. F/U if worsens.     Riki Sheer, PA-C 11/01/13 1630

## 2013-11-01 NOTE — ED Provider Notes (Signed)
Medical screening examination/treatment/procedure(s) were performed by resident physician or non-physician practitioner and as supervising physician I was immediately available for consultation/collaboration.   Mahkai Fangman DOUGLAS MD.   Zoye Chandra D Aliany Fiorenza, MD 11/01/13 1859 

## 2013-12-01 ENCOUNTER — Encounter (HOSPITAL_COMMUNITY): Payer: Self-pay | Admitting: Emergency Medicine

## 2013-12-01 ENCOUNTER — Emergency Department (HOSPITAL_COMMUNITY)
Admission: EM | Admit: 2013-12-01 | Discharge: 2013-12-01 | Disposition: A | Discharge status: Home / Self Care | Payer: BC Managed Care – PPO | Attending: Emergency Medicine | Admitting: Emergency Medicine

## 2013-12-01 ENCOUNTER — Emergency Department (HOSPITAL_COMMUNITY): Payer: BC Managed Care – PPO

## 2013-12-01 DIAGNOSIS — R079 Chest pain, unspecified: Secondary | ICD-10-CM | POA: Insufficient documentation

## 2013-12-01 DIAGNOSIS — Z3202 Encounter for pregnancy test, result negative: Secondary | ICD-10-CM | POA: Insufficient documentation

## 2013-12-01 DIAGNOSIS — I10 Essential (primary) hypertension: Secondary | ICD-10-CM | POA: Insufficient documentation

## 2013-12-01 DIAGNOSIS — Z79899 Other long term (current) drug therapy: Secondary | ICD-10-CM | POA: Insufficient documentation

## 2013-12-01 DIAGNOSIS — G43909 Migraine, unspecified, not intractable, without status migrainosus: Secondary | ICD-10-CM | POA: Insufficient documentation

## 2013-12-01 DIAGNOSIS — E860 Dehydration: Secondary | ICD-10-CM | POA: Insufficient documentation

## 2013-12-01 DIAGNOSIS — R55 Syncope and collapse: Secondary | ICD-10-CM | POA: Insufficient documentation

## 2013-12-01 HISTORY — DX: Migraine, unspecified, not intractable, without status migrainosus: G43.909

## 2013-12-01 LAB — COMPREHENSIVE METABOLIC PANEL
ALT: 11 U/L (ref 0–35)
AST: 15 U/L (ref 0–37)
Albumin: 3.8 g/dL (ref 3.5–5.2)
Alkaline Phosphatase: 103 U/L (ref 39–117)
BUN: 12 mg/dL (ref 6–23)
CO2: 23 mEq/L (ref 19–32)
Calcium: 9 mg/dL (ref 8.4–10.5)
Chloride: 105 mEq/L (ref 96–112)
Creatinine, Ser: 0.95 mg/dL (ref 0.50–1.10)
GFR calc Af Amer: 86 mL/min — ABNORMAL LOW (ref >90)
GFR calc non Af Amer: 74 mL/min — ABNORMAL LOW (ref >90)
Glucose, Bld: 95 mg/dL (ref 70–99)
Potassium: 4.7 mEq/L (ref 3.7–5.3)
Sodium: 141 mEq/L (ref 137–147)
Total Bilirubin: <0.2 mg/dL — ABNORMAL LOW (ref 0.3–1.2)
Total Protein: 7.6 g/dL (ref 6.0–8.3)

## 2013-12-01 LAB — CBC WITH DIFFERENTIAL/PLATELET
Basophils Absolute: 0 10*3/uL (ref 0.0–0.1)
Basophils Relative: 0 % (ref 0–1)
Eosinophils Absolute: 0 10*3/uL (ref 0.0–0.7)
Eosinophils Relative: 0 % (ref 0–5)
HCT: 31.5 % — ABNORMAL LOW (ref 36.0–46.0)
Hemoglobin: 9.5 g/dL — ABNORMAL LOW (ref 12.0–15.0)
Lymphocytes Relative: 37 % (ref 12–46)
Lymphs Abs: 3.4 10*3/uL (ref 0.7–4.0)
MCH: 24.1 pg — ABNORMAL LOW (ref 26.0–34.0)
MCHC: 30.2 g/dL (ref 30.0–36.0)
MCV: 79.9 fL (ref 78.0–100.0)
Monocytes Absolute: 0.6 10*3/uL (ref 0.1–1.0)
Monocytes Relative: 7 % (ref 3–12)
Neutro Abs: 5.2 10*3/uL (ref 1.7–7.7)
Neutrophils Relative %: 56 % (ref 43–77)
Platelets: 408 10*3/uL — ABNORMAL HIGH (ref 150–400)
RBC: 3.94 MIL/uL (ref 3.87–5.11)
RDW: 18.9 % — ABNORMAL HIGH (ref 11.5–15.5)
WBC: 9.2 10*3/uL (ref 4.0–10.5)

## 2013-12-01 LAB — URINALYSIS, ROUTINE W REFLEX MICROSCOPIC
Glucose, UA: NEGATIVE mg/dL
Hgb urine dipstick: NEGATIVE
Ketones, ur: NEGATIVE mg/dL
Leukocytes, UA: NEGATIVE
Nitrite: NEGATIVE
Protein, ur: NEGATIVE mg/dL
Specific Gravity, Urine: 1.029 (ref 1.005–1.030)
Urobilinogen, UA: 1 mg/dL (ref 0.0–1.0)
pH: 5.5 (ref 5.0–8.0)

## 2013-12-01 LAB — CBG MONITORING, ED: Glucose-Capillary: 96 mg/dL (ref 70–99)

## 2013-12-01 LAB — D-DIMER, QUANTITATIVE: D-Dimer, Quant: 0.43 ug/mL-FEU (ref 0.00–0.48)

## 2013-12-01 LAB — I-STAT TROPONIN, ED
Troponin i, poc: 0.01 ng/mL (ref 0.00–0.08)
Troponin i, poc: 0.01 ng/mL (ref 0.00–0.08)

## 2013-12-01 LAB — PREGNANCY, URINE: Preg Test, Ur: NEGATIVE

## 2013-12-01 MED ORDER — IBUPROFEN 200 MG PO TABS
600.0000 mg | ORAL_TABLET | Freq: Once | ORAL | Status: AC
  Administered 2013-12-01: 600 mg via ORAL
  Filled 2013-12-01: qty 3

## 2013-12-01 MED ORDER — SODIUM CHLORIDE 0.9 % IV BOLUS (SEPSIS)
1000.0000 mL | Freq: Once | INTRAVENOUS | Status: AC
Start: 2013-12-01 — End: 2013-12-01
  Administered 2013-12-01: 1000 mL via INTRAVENOUS

## 2013-12-01 MED ORDER — PROCHLORPERAZINE EDISYLATE 5 MG/ML IJ SOLN
10.0000 mg | Freq: Four times a day (QID) | INTRAMUSCULAR | Status: DC | PRN
  Administered 2013-12-01: 10 mg via INTRAVENOUS
  Filled 2013-12-01: qty 2

## 2013-12-01 MED ORDER — SODIUM CHLORIDE 0.9 % IV BOLUS (SEPSIS)
1000.0000 mL | Freq: Once | INTRAVENOUS | Status: AC
  Administered 2013-12-01: 1000 mL via INTRAVENOUS

## 2013-12-01 MED ORDER — PROMETHAZINE HCL 25 MG/ML IJ SOLN
12.5000 mg | Freq: Four times a day (QID) | INTRAMUSCULAR | Status: DC | PRN
  Administered 2013-12-01: 12.5 mg via INTRAVENOUS
  Filled 2013-12-01: qty 1

## 2013-12-01 NOTE — Progress Notes (Signed)
[facility] provided pt with a list of primary care resources and a [facility] [employer] Card application to help patient establish primary care.  °

## 2013-12-01 NOTE — ED Notes (Signed)
Patient transported to CT and X ray 

## 2013-12-01 NOTE — ED Provider Notes (Signed)
Medical screening examination/treatment/procedure(s) were conducted as a shared visit with non-physician practitioner(s) and myself.  I personally evaluated the patient during the encounter.  Gradual onset headache since this morning.  Dizziness and lightheadedness followed by syncopal episode while walking.  Denies sudden worsening of headache.  Intermittent and transient chest pain after syncope. Headache is similar to previous migraine and preceded syncope. HCG -. No brugada or prolonged QT on EKG.  CN 2-12 intact, no ataxia on finger to nose, no nystagmus, 5/5 strength throughout, no pronator drift, Romberg negative, normal gait.    EKG Interpretation   Date/Time:  Monday December 01 2013 10:58:36 EDT Ventricular Rate:  85 PR Interval:  172 QRS Duration: 84 QT Interval:  369 QTC Calculation: 439 R Axis:   24 Text Interpretation:  Sinus rhythm No previous ECGs available Confirmed by  Manus GunningANCOUR  MD, Sullivan Blasing ([id number]) on 12/01/2013 11:08:33 AM       Glynn OctaveStephen Oley Lahaie, MD 12/01/13 [phone]

## 2013-12-01 NOTE — Discharge Instructions (Signed)
Dehydration, Adult °Dehydration is when you lose more fluids from the body than you take in. Vital organs like the kidneys, brain, and heart cannot function without a proper amount of fluids and salt. Any loss of fluids from the body can cause dehydration.  °CAUSES  °· Vomiting. °· Diarrhea. °· Excessive sweating. °· Excessive urine output. °· Fever. °SYMPTOMS  °Mild dehydration °· Thirst. °· Dry lips. °· Slightly dry mouth. °Moderate dehydration °· Very dry mouth. °· Sunken eyes. °· Skin does not bounce back quickly when lightly pinched and released. °· Dark urine and decreased urine production. °· Decreased tear production. °· Headache. °Severe dehydration °· Very dry mouth. °· Extreme thirst. °· Rapid, weak pulse (more than 100 beats per minute at rest). °· Cold hands and feet. °· Not able to sweat in spite of heat and temperature. °· Rapid breathing. °· Blue lips. °· Confusion and lethargy. °· Difficulty being awakened. °· Minimal urine production. °· No tears. °DIAGNOSIS  °Your caregiver will diagnose dehydration based on your symptoms and your exam. Blood and urine tests will help confirm the diagnosis. The diagnostic evaluation should also identify the cause of dehydration. °TREATMENT  °Treatment of mild or moderate dehydration can often be done at home by increasing the amount of fluids that you drink. It is best to drink small amounts of fluid more often. Drinking too much at one time can make vomiting worse. Refer to the home care instructions below. °Severe dehydration needs to be treated at the hospital where you will probably be given intravenous (IV) fluids that contain water and electrolytes. °HOME CARE INSTRUCTIONS  °· Ask your caregiver about specific rehydration instructions. °· Drink enough fluids to keep your urine clear or pale yellow. °· Drink small amounts frequently if you have nausea and vomiting. °· Eat as you normally do. °· Avoid: °¨ Foods or drinks high in sugar. °¨ Carbonated  drinks. °¨ Juice. °¨ Extremely hot or cold fluids. °¨ Drinks with caffeine. °¨ Fatty, greasy foods. °¨ Alcohol. °¨ Tobacco. °¨ Overeating. °¨ Gelatin desserts. °· Wash your hands well to avoid spreading bacteria and viruses. °· Only take over-the-counter or prescription medicines for pain, discomfort, or fever as directed by your caregiver. °· Ask your caregiver if you should continue all prescribed and over-the-counter medicines. °· Keep all follow-up appointments with your caregiver. °SEEK MEDICAL CARE IF: °· You have abdominal pain and it increases or stays in one area (localizes). °· You have a rash, stiff neck, or severe headache. °· You are irritable, sleepy, or difficult to awaken. °· You are weak, dizzy, or extremely thirsty. °SEEK IMMEDIATE MEDICAL CARE IF:  °· You are unable to keep fluids down or you get worse despite treatment. °· You have frequent episodes of vomiting or diarrhea. °· You have blood or green matter (bile) in your vomit. °· You have blood in your stool or your stool looks black and tarry. °· You have not urinated in 6 to 8 hours, or you have only urinated a small amount of very dark urine. °· You have a fever. °· You faint. °MAKE SURE YOU:  °· Understand these instructions. °· Will watch your condition. °· Will get help right away if you are not doing well or get worse. °Document Released: 05/22/2005 Document Revised: 08/14/2011 Document Reviewed: 01/09/2011 °ExitCare® Patient Information ©2015 [employer]. This information is not intended to replace advice given to you by your health care provider. Make sure you discuss any questions you have with your health care   provider. ° °Near-Syncope °Near-syncope (commonly known as near fainting) is sudden weakness, dizziness, or feeling like you might pass out. During an episode of near-syncope, you may also develop pale skin, have tunnel vision, or feel sick to your stomach (nauseous). Near-syncope may occur when getting up after sitting or  while standing for a long time. It is caused by a sudden decrease in blood flow to the brain. This decrease can result from various causes or triggers, most of which are not serious. However, because near-syncope can sometimes be a sign of something serious, a medical evaluation is required. The specific cause is often not determined. °HOME CARE INSTRUCTIONS  °Monitor your condition for any changes. The following actions may help to alleviate any discomfort you are experiencing: °· Have someone stay with you until you feel stable. °· Lie down right away and prop your feet up if you start feeling like you might faint. Breathe deeply and steadily. Wait until all the symptoms have passed. Most of these episodes last only a few minutes. You may feel tired for several hours.   °· Drink enough fluids to keep your urine clear or pale yellow.   °· If you are taking blood pressure or heart medicine, get up slowly when seated or lying down. Take several minutes to sit and then stand. This can reduce dizziness. °· Follow up with your health care provider as directed.  °SEEK IMMEDIATE MEDICAL CARE IF:  °· You have a severe headache.   °· You have unusual pain in the chest, abdomen, or back.   °· You are bleeding from the mouth or rectum, or you have black or tarry stool.   °· You have an irregular or very fast heartbeat.   °· You have repeated fainting or have seizure-like jerking during an episode.   °· You faint when sitting or lying down.   °· You have confusion.   °· You have difficulty walking.   °· You have severe weakness.   °· You have vision problems.   °MAKE SURE YOU:  °· Understand these instructions. °· Will watch your condition. °· Will get help right away if you are not doing well or get worse. °Document Released: 05/22/2005 Document Revised: 05/27/2013 Document Reviewed: 10/25/2012 °ExitCare® Patient Information ©2015 [employer]. This information is not intended to replace advice given to you by your health  care provider. Make sure you discuss any questions you have with your health care provider. ° °

## 2013-12-01 NOTE — ED Notes (Signed)
Bed: NW29WA05 Expected date:  Expected time:  Means of arrival:  Comments: ems- syncopal episode

## 2013-12-01 NOTE — ED Notes (Signed)
Per EMS pt comes from work for near syncope, dizziness, and right sided head/facial pain.  Pt reports to EMS that she is under a lot of stress at work. Pt states she works at [employer].  Pt reports to she takes Seroquel and Wellbutrin for her migraines.  CBG 142, BP 136/82.

## 2013-12-01 NOTE — ED Provider Notes (Signed)
CSN: [id number]     Arrival date & time 12/01/13  1056 History   First MD Initiated Contact with Patient 12/01/13 1059     Chief Complaint  Patient presents with  . Dizziness  . Headache     (Consider location/radiation/quality/duration/timing/severity/associated sxs/prior Treatment) HPI  Patient to the ED with complaints of syncopal episode. She reports being very stressed at work and having a headache. She turned a corner and next thing she knew she was waking up on the ground with pain in her chest and people standing over her. The pain is reproducible she says with pressure. She has a hx of migraines but says this feels more like a normal headache but that it is bad. The headache is on the right side of her head and face. No neck pain. No lacerations or obvious deformities. Denies back pain. Denies acute onset of the headache. She takes Seroquel and Wellbutrin for headache. Also admits to not eating this morning. Blood pressure in triage is 114/62. P  Past Medical History  Diagnosis Date  . Hypertension   . Migraines    Past Surgical History  Procedure Laterality Date  . Cesarean section    . Cesarean section     No family history on file. History  Substance Use Topics  . Smoking status: Never Smoker   . Smokeless tobacco: Never Used  . Alcohol Use: No   OB History   Grav Para Term Preterm Abortions TAB SAB Ect Mult Living                 Review of Systems   Review of Systems  Gen: no weight loss, fevers, chills, night sweats  Eyes: no discharge or drainage, no occular pain or visual changes  Nose: no epistaxis or rhinorrhea  Mouth: no dental pain, no sore throat  Neck: no neck pain  Lungs:No wheezing, coughing or hemoptysis CV: + chest pain, No palpitations, dependent edema or orthopnea  Abd: no abdominal pain, nausea, vomiting, diarrhea GU: no dysuria or gross hematuria  MSK:  No muscle weakness or pain Neuro: + headache and syncope, no focal neurologic  deficits  Skin: no rash or wounds Psyche: no complaints    Allergies  Review of patient's allergies indicates no known allergies.  Home Medications   Prior to Admission medications   Medication Sig Start Date End Date Taking? Authorizing Yailin Biederman  acetaminophen (TYLENOL) 500 MG tablet Take 1,000 mg by mouth every 6 (six) hours as needed for headache.   Yes Historical Hoy Fallert, MD  aspirin-acetaminophen-caffeine (EXCEDRIN MIGRAINE) [phone] MG per tablet Take 2 tablets by mouth every 6 (six) hours as needed. For migraine   Yes Historical Sierria Bruney, MD  buPROPion (WELLBUTRIN) 75 MG tablet Take 75 mg by mouth 2 (two) times daily.   Yes Historical Mykai Wendorf, MD  ferrous sulfate 325 (65 FE) MG tablet Take 325 mg by mouth daily with breakfast.   Yes Historical Kiaya Haliburton, MD  ibuprofen (ADVIL,MOTRIN) 200 MG tablet Take 800 mg by mouth every 6 (six) hours as needed for headache.   Yes Historical Aziah Brostrom, MD  lisinopril-hydrochlorothiazide (PRINZIDE,ZESTORETIC) 20-25 MG per tablet Take 1 tablet by mouth daily.   Yes Historical Rod Majerus, MD  medroxyPROGESTERone (DEPO-SUBQ PROVERA) 104 MG/0.65ML injection Inject 104 mg into the skin every 3 (three) months.   Yes Historical Jamarii Banks, MD  SUMAtriptan (IMITREX) 100 MG tablet Take 1 tablet (100 mg total) by mouth every 2 (two) hours as needed for migraine or headache. May repeat in 2  hours if headache persists or recurs. 11/01/13  Yes Dillard Cannon Young, PA-C   BP 111/54  Pulse 86  Temp(Src) 97.6 F (36.4 C) (Oral)  Resp 17  SpO2 100% Physical Exam  Nursing note and vitals reviewed. Constitutional: She is oriented to person, place, and time. She appears well-developed and well-nourished. No distress.  HENT:  Head: Normocephalic and atraumatic.  Eyes: Pupils are equal, round, and reactive to light.  Neck: Normal range of motion. Neck supple.  Cardiovascular: Normal rate and regular rhythm.   Pulmonary/Chest: Effort normal.  Abdominal: Soft.   Neurological: She is alert and oriented to person, place, and time. She has normal strength. No cranial nerve deficit or sensory deficit.  Skin: Skin is warm and dry.    ED Course  Procedures (including critical care time) Labs Review Labs Reviewed  URINALYSIS, ROUTINE W REFLEX MICROSCOPIC - Abnormal; Notable for the following:    Bilirubin Urine SMALL (*)    All other components within normal limits  CBC WITH DIFFERENTIAL - Abnormal; Notable for the following:    Hemoglobin 9.5 (*)    HCT 31.5 (*)    MCH 24.1 (*)    RDW 18.9 (*)    Platelets 408 (*)    All other components within normal limits  COMPREHENSIVE METABOLIC PANEL - Abnormal; Notable for the following:    Total Bilirubin <0.2 (*)    GFR calc non Af Amer 74 (*)    GFR calc Af Amer 86 (*)    All other components within normal limits  PREGNANCY, URINE  D-DIMER, QUANTITATIVE  CBG MONITORING, ED  I-STAT TROPOININ, ED  I-STAT TROPOININ, ED    Imaging Review Dg Chest 2 View  12/01/2013   CLINICAL DATA:  Chest pain and dizziness with history of hypertension  EXAM: CHEST  2 VIEW  COMPARISON:  PA and lateral chest x-ray of August 08, 2013  FINDINGS: The lungs remain borderline hypoinflated. There is no focal infiltrate. There is increased prominence of the left hilar structures. The cardiac silhouette is normal in size. There is no pleural effusion. The trachea is midline. The bony thorax is unremarkable.  IMPRESSION: There is prominence of the left hilar structures likely related to vascular crowding due to mild hypo inflation. A repeat PA chest film with deep inspiration would be useful. If increased density here persists, chest CT scanning now would be indicated.   Electronically Signed   By: David  [place]   On: 12/01/2013 13:04   Ct Head Wo Contrast  12/01/2013   CLINICAL DATA:  Headache and dizziness and syncope  EXAM: CT HEAD WITHOUT CONTRAST  TECHNIQUE: Contiguous axial images were obtained from the base of the skull  through the vertex without intravenous contrast.  COMPARISON:  Noncontrast CT scan of the brain of September 06, 2005  FINDINGS: The ventricles are normal in size and position. There is no intracranial hemorrhage nor intracranial mass effect. There is no evolving ischemic change. The cerebellum and brainstem are normal in density.  The paranasal sinuses and mastoid air cells are clear. There is no skull fracture.  IMPRESSION: Normal noncontrast CT scan of the brain.   Electronically Signed   By: David  [place]   On: 12/01/2013 13:21     EKG Interpretation   Date/Time:  Monday December 01 2013 10:58:36 EDT Ventricular Rate:  85 PR Interval:  172 QRS Duration: 84 QT Interval:  369 QTC Calculation: 439 R Axis:   24 Text Interpretation:  Sinus rhythm No previous  ECGs available Confirmed by  RANCOUR  MD, STEPHEN [phone]([id number]) on 12/01/2013 11:08:33 AM      MDM   Final diagnoses:  Dehydration  Syncope, unspecified syncope type   1:12 pm Discussed patient with Dr. Manus Gunningancour. He has been made aware of patients HPI and work-up plan.  3:19 pm Patient has been seen by Dr. Manus Gunningancour, she has had PO challenge, ambulated and eaten in the ED.  Due to patient presentation and work up we do not feel that this is PE, ectopic pregnancy, SAH, ACS, CVA or any other concerning abnormality at this time. The patient clinically appeared to be dehydrated and is feeling much better. Headache pain easily controlled in the ED. She is to follow-up with her PCP.  38 y.o.Kathryn Middleton's evaluation in the Emergency Department is complete. It has been determined that no acute conditions requiring further emergency intervention are present at this time. The patient/guardian have been advised of the diagnosis and plan. We have discussed signs and symptoms that warrant return to the ED, such as changes or worsening in symptoms.  Vital signs are stable at discharge. Filed Vitals:   12/01/13 1404  BP: 111/54  Pulse: 86  Temp: 97.6 F  (36.4 C)  Resp: 17    Patient/guardian has voiced understanding and agreed to follow-up with the PCP or specialist.     Dorthula Matasiffany G Greene, PA-C 12/01/13 1521

## 2013-12-04 ENCOUNTER — Emergency Department (INDEPENDENT_AMBULATORY_CARE_PROVIDER_SITE_OTHER)
Admission: EM | Admit: 2013-12-04 | Discharge: 2013-12-04 | Disposition: A | Discharge status: Home / Self Care | Payer: Self-pay | Source: Home / Self Care | Attending: Family Medicine | Admitting: Family Medicine

## 2013-12-04 ENCOUNTER — Encounter (HOSPITAL_COMMUNITY): Payer: Self-pay | Admitting: Emergency Medicine

## 2013-12-04 DIAGNOSIS — G43909 Migraine, unspecified, not intractable, without status migrainosus: Secondary | ICD-10-CM

## 2013-12-04 MED ORDER — KETOROLAC TROMETHAMINE 30 MG/ML IJ SOLN
INTRAMUSCULAR | Status: AC
  Filled 2013-12-04: qty 1

## 2013-12-04 MED ORDER — KETOROLAC TROMETHAMINE 30 MG/ML IJ SOLN
30.0000 mg | Freq: Once | INTRAMUSCULAR | Status: AC
  Administered 2013-12-04: 30 mg via INTRAMUSCULAR

## 2013-12-04 MED ORDER — ONDANSETRON 4 MG PO TBDP
ORAL_TABLET | ORAL | Status: AC
  Filled 2013-12-04: qty 1

## 2013-12-04 MED ORDER — ONDANSETRON 4 MG PO TBDP
4.0000 mg | ORAL_TABLET | Freq: Once | ORAL | Status: AC
  Administered 2013-12-04: 4 mg via ORAL

## 2013-12-04 MED ORDER — ONDANSETRON HCL 4 MG PO TABS: 4.0000 mg | ORAL_TABLET | Freq: Three times a day (TID) | ORAL | Status: DC | PRN

## 2013-12-04 NOTE — ED Notes (Signed)
C/o headache.  Reports pain in left side of head.  Reports this is usual location of pain with migraines.  Patient seen in ed for syncopal episode 12/01/13.  Patient had extensive work up and discharged home.

## 2013-12-04 NOTE — ED Provider Notes (Signed)
CSN: [id number]     Arrival date & time 12/04/13  [id number] History   First MD Initiated Contact with Patient 12/04/13 [phone]     Chief Complaint  Patient presents with  . Headache   (Consider location/radiation/quality/duration/timing/severity/associated sxs/prior Treatment) HPI Comments: Has not attempted to take her migraine rescue medication (Imitrex). Underwent length evaluation at [facility] on 12-01-2013 for syncopal episode including CT head (normal results).   Patient is a 38 y.o. female presenting with headaches. The history is provided by the patient.  Headache Pain location:  Frontal (on left) Quality: throbbing. Radiates to:  Does not radiate Onset quality:  Gradual Duration:  1 day Timing:  Constant Progression:  Worsening Chronicity:  Recurrent Similar to prior headaches: yes   Associated symptoms: nausea and photophobia   Associated symptoms: no abdominal pain, no back pain, no blurred vision, no congestion, no cough, no diarrhea, no dizziness, no drainage, no ear pain, no pain, no facial pain, no fatigue, no fever, no focal weakness, no hearing loss, no loss of balance, no myalgias, no near-syncope, no neck pain, no neck stiffness, no numbness, no paresthesias, no seizures, no sinus pressure, no sore throat, no swollen glands, no syncope, no tingling, no URI, no visual change, no vomiting and no weakness     Past Medical History  Diagnosis Date  . Hypertension   . Migraines    Past Surgical History  Procedure Laterality Date  . Cesarean section    . Cesarean section     No family history on file. History  Substance Use Topics  . Smoking status: Never Smoker   . Smokeless tobacco: Never Used  . Alcohol Use: No   OB History   Grav Para Term Preterm Abortions TAB SAB Ect Mult Living                 Review of Systems  Constitutional: Negative for fever and fatigue.  HENT: Negative for congestion, ear pain, hearing loss, postnasal drip, sinus pressure and sore  throat.   Eyes: Positive for photophobia. Negative for blurred vision and pain.  Respiratory: Negative for cough.   Cardiovascular: Negative for syncope and near-syncope.  Gastrointestinal: Positive for nausea. Negative for vomiting, abdominal pain and diarrhea.  Musculoskeletal: Negative for back pain, myalgias, neck pain and neck stiffness.  Neurological: Positive for headaches. Negative for dizziness, focal weakness, seizures, numbness, paresthesias and loss of balance.    Allergies  Review of patient's allergies indicates no known allergies.  Home Medications   Prior to Admission medications   Medication Sig Start Date End Date Taking? Authorizing Provider  buPROPion (WELLBUTRIN) 75 MG tablet Take 75 mg by mouth 2 (two) times daily.   Yes Historical Provider, MD  ibuprofen (ADVIL,MOTRIN) 200 MG tablet Take 800 mg by mouth every 6 (six) hours as needed for headache.   Yes Historical Provider, MD  lisinopril-hydrochlorothiazide (PRINZIDE,ZESTORETIC) 20-25 MG per tablet Take 1 tablet by mouth daily.   Yes Historical Provider, MD  acetaminophen (TYLENOL) 500 MG tablet Take 1,000 mg by mouth every 6 (six) hours as needed for headache.    Historical Provider, MD  aspirin-acetaminophen-caffeine (EXCEDRIN MIGRAINE) [phone] MG per tablet Take 2 tablets by mouth every 6 (six) hours as needed. For migraine    Historical Provider, MD  ferrous sulfate 325 (65 FE) MG tablet Take 325 mg by mouth daily with breakfast.    Historical Provider, MD  medroxyPROGESTERone (DEPO-SUBQ PROVERA) 104 MG/0.65ML injection Inject 104 mg into the skin every 3 (  three) months.    Historical Provider, MD  ondansetron (ZOFRAN) 4 MG tablet Take 1 tablet (4 mg total) by mouth every 8 (eight) hours as needed for nausea or vomiting. 12/04/13   Ardis RowanJennifer Lee Garnet Overfield, PA  SUMAtriptan (IMITREX) 100 MG tablet Take 1 tablet (100 mg total) by mouth every 2 (two) hours as needed for migraine or headache. May repeat in 2 hours if  headache persists or recurs. 11/01/13   Riki SheerMichelle G Young, PA-C   BP 119/83  Pulse 109  Temp(Src) 98.3 F (36.8 C) (Oral)  SpO2 98% Physical Exam  Nursing note and vitals reviewed. Constitutional: She is oriented to person, place, and time. She appears well-developed and well-nourished. No distress.  HENT:  Head: Normocephalic and atraumatic.  Right Ear: External ear normal.  Left Ear: External ear normal.  Nose: Nose normal.  Eyes: Conjunctivae and EOM are normal. Pupils are equal, round, and reactive to light. Right eye exhibits no discharge. Left eye exhibits no discharge. No scleral icterus.  Neck: Normal range of motion and full passive range of motion without pain. Neck supple. No muscular tenderness present.  Cardiovascular: Normal rate, regular rhythm and normal heart sounds.   Pulmonary/Chest: Effort normal and breath sounds normal.  Abdominal: Soft. Bowel sounds are normal. She exhibits no distension. There is no tenderness.  +overweight  Musculoskeletal: Normal range of motion.  Neurological: She is alert and oriented to person, place, and time. She has normal strength. No cranial nerve deficit or sensory deficit. Coordination and gait normal. GCS eye subscore is 4. GCS verbal subscore is 5. GCS motor subscore is 6.  Skin: Skin is warm and dry.  Psychiatric: She has a normal mood and affect. Her behavior is normal.    ED Course  Procedures (including critical care time) Labs Review Labs Reviewed - No data to display  Imaging Review No results found.   MDM   1. Migraine without status migrainosus, not intractable, unspecified migraine type   Encouraged patient to use her Imitrex as prescribed for her migraine headaches. Zofran as directed for nausea. Improve oral hydration. Rest and regular meals. If symptoms become suddenly worse or severe and persistent, seek re-evaluation at her nearest emergency room. Patient provided [facility]fran 4 mg ODT [facility] and reports some improvement. Exam without worrisome or focal finding.   Jess BartersJennifer Lee Lan[place] CityPresson, [place] 12/04/13 1136

## 2013-12-04 NOTE — Discharge Instructions (Signed)

## 2013-12-04 NOTE — ED Notes (Signed)
[facility] to [facility] to [facility]

## 2013-12-05 NOTE — ED Provider Notes (Signed)
Medical screening examination/treatment/procedure(s) were performed by a resident physician or non-physician practitioner and as the supervising physician I was immediately available for consultation/collaboration.  Jaekwon Mcclune, MD    Corin Formisano S Terrika Zuver, MD 12/05/13 1650 

## 2014-01-09 ENCOUNTER — Encounter (HOSPITAL_COMMUNITY): Payer: Self-pay | Admitting: Emergency Medicine

## 2014-01-09 ENCOUNTER — Emergency Department (INDEPENDENT_AMBULATORY_CARE_PROVIDER_SITE_OTHER)
Admission: EM | Admit: 2014-01-09 | Discharge: 2014-01-09 | Disposition: A | Discharge status: Home / Self Care | Payer: Self-pay | Source: Home / Self Care | Attending: Family Medicine | Admitting: Family Medicine

## 2014-01-09 DIAGNOSIS — G43109 Migraine with aura, not intractable, without status migrainosus: Secondary | ICD-10-CM

## 2014-01-09 MED ORDER — ACETAMINOPHEN-CODEINE #3 300-30 MG PO TABS: 1.0000 | ORAL_TABLET | ORAL | Status: DC | PRN

## 2014-01-09 MED ORDER — DIPHENHYDRAMINE HCL 50 MG/ML IJ SOLN
INTRAMUSCULAR | Status: AC
  Filled 2014-01-09: qty 1

## 2014-01-09 MED ORDER — DIPHENHYDRAMINE HCL 50 MG/ML IJ SOLN
25.0000 mg | Freq: Once | INTRAMUSCULAR | Status: AC
  Administered 2014-01-09: 25 mg via INTRAMUSCULAR

## 2014-01-09 MED ORDER — DEXAMETHASONE SODIUM PHOSPHATE 10 MG/ML IJ SOLN
10.0000 mg | Freq: Once | INTRAMUSCULAR | Status: AC
  Administered 2014-01-09: 10 mg via INTRAMUSCULAR

## 2014-01-09 MED ORDER — DIPHENHYDRAMINE HCL 50 MG/ML IJ SOLN: 25.0000 mg | Freq: Once | INTRAMUSCULAR | Status: DC

## 2014-01-09 NOTE — ED Provider Notes (Signed)
CSN: [id number]     Arrival date & time 01/09/14  1119 History   First MD Initiated Contact with Patient 01/09/14 1156     Chief Complaint  Patient presents with  . Headache   (Consider location/radiation/quality/duration/timing/severity/associated sxs/prior Treatment) HPI  HA: started this morning. Took an imitrex w/o benefit. H/o migraines. Migraines typically throbbing and on R. Associated w/ nausea. N onumbness or tingling. Worse w/ sneezing (pt also w/ cold). Light and noise make it worse. Denies any CP, palpitations, syncope, one sided weakness, SZR, loss of bowel or bladder function. Denies any changes in medications recently.   Past Medical History  Diagnosis Date  . Hypertension   . Migraines    Past Surgical History  Procedure Laterality Date  . Cesarean section    . Cesarean section     History reviewed. No pertinent family history. History  Substance Use Topics  . Smoking status: Never Smoker   . Smokeless tobacco: Never Used  . Alcohol Use: No   OB History   Grav Para Term Preterm Abortions TAB SAB Ect Mult Living                 Review of Systems Per HPI with all other pertinent systems negative.   Allergies  Review of patient's allergies indicates no known allergies.  Home Medications   Prior to Admission medications   Medication Sig Start Date End Date Taking? Authorizing Provider  acetaminophen (TYLENOL) 500 MG tablet Take 1,000 mg by mouth every 6 (six) hours as needed for headache.    Historical Provider, MD  acetaminophen-codeine (TYLENOL #3) 300-30 MG per tablet Take 1 tablet by mouth every 4 (four) hours as needed for moderate pain. 01/09/14   Ozella Rocks, MD  aspirin-acetaminophen-caffeine (EXCEDRIN MIGRAINE) [id number] MG per tablet Take 2 tablets by mouth every 6 (six) hours as needed. For migraine    Historical Provider, MD  buPROPion (WELLBUTRIN) 75 MG tablet Take 75 mg by mouth 2 (two) times daily.    Historical Provider, MD  ferrous  sulfate 325 (65 FE) MG tablet Take 325 mg by mouth daily with breakfast.    Historical Provider, MD  ibuprofen (ADVIL,MOTRIN) 200 MG tablet Take 800 mg by mouth every 6 (six) hours as needed for headache.    Historical Provider, MD  lisinopril-hydrochlorothiazide (PRINZIDE,ZESTORETIC) 20-25 MG per tablet Take 1 tablet by mouth daily.    Historical Provider, MD  medroxyPROGESTERone (DEPO-SUBQ PROVERA) 104 MG/0.65ML injection Inject 104 mg into the skin every 3 (three) months.    Historical Provider, MD  ondansetron (ZOFRAN) 4 MG tablet Take 1 tablet (4 mg total) by mouth every 8 (eight) hours as needed for nausea or vomiting. 12/04/13   Mathis Fare Presson, PA  SUMAtriptan (IMITREX) 100 MG tablet Take 1 tablet (100 mg total) by mouth every 2 (two) hours as needed for migraine or headache. May repeat in 2 hours if headache persists or recurs. 11/01/13   Riki Sheer, PA-C   BP 123/84  Pulse 103  Temp(Src) 98.9 F (37.2 C) (Oral)  Resp 19  SpO2 95% Physical Exam  Constitutional: She is oriented to person, place, and time. She appears well-developed and well-nourished. No distress.  HENT:  Head: Normocephalic and atraumatic.  Eyes: EOM are normal. Pupils are equal, round, and reactive to light. Right eye exhibits no discharge. Left eye exhibits no discharge. No scleral icterus.  Neck: Normal range of motion. No JVD present. No thyromegaly present.  Cardiovascular: Normal rate, normal  heart sounds and intact distal pulses.   No murmur heard. Pulmonary/Chest: Effort normal and breath sounds normal. No respiratory distress.  Abdominal: Soft. She exhibits no distension.  Musculoskeletal: Normal range of motion. She exhibits no edema and no tenderness.  Lymphadenopathy:    She has no cervical adenopathy.  Neurological: She is alert and oriented to person, place, and time. No cranial nerve deficit. She exhibits normal muscle tone. Coordination normal.  Skin: Skin is warm. No rash noted. She is  not diaphoretic.  Psychiatric: She has a normal mood and affect. Her behavior is normal. Judgment and thought content normal.    ED Course  Procedures (including critical care time) Labs Review Labs Reviewed - No data to display  Imaging Review No results found.   MDM   1. Migraine with aura and without status migrainosus, not intractable    Recent CT scan nml  Decadron 10mg  IM Benadryl 25mg  IM - tylenol #3 at home - rest and return precautions discussed No need for initiation of long term prophylactic therapy at this time    Shelly Flattenavid Merrell, MD Family Medicine 01/09/2014, 12:49 PM    Ozella Rocksavid J Merrell, MD 01/09/14 1249

## 2014-01-09 NOTE — ED Notes (Signed)
C/o HA onset today, unable to break her HA w OTC medications, used her imitrex w/o relief; NAD at present, but c/o pain '9' on 1-10 scale

## 2014-01-09 NOTE — Discharge Instructions (Signed)
You likely are suffering from a migraine. Please go home and rest Use the tylenol #3 as needed  You can take up to 1gram 3 times a day Please get lots of rest adn follow up as needed  Migraine Headache A migraine headache is an intense, throbbing pain on one or both sides of your head. A migraine can last for 30 minutes to several hours. CAUSES  The exact cause of a migraine headache is not always known. However, a migraine may be caused when nerves in the brain become irritated and release chemicals that cause inflammation. This causes pain. Certain things may also trigger migraines, such as:  Alcohol.  Smoking.  Stress.  Menstruation.  Aged cheeses.  Foods or drinks that contain nitrates, glutamate, aspartame, or tyramine.  Lack of sleep.  Chocolate.  Caffeine.  Hunger.  Physical exertion.  Fatigue.  Medicines used to treat chest pain (nitroglycerine), birth control pills, estrogen, and some blood pressure medicines. SIGNS AND SYMPTOMS  Pain on one or both sides of your head.  Pulsating or throbbing pain.  Severe pain that prevents daily activities.  Pain that is aggravated by any physical activity.  Nausea, vomiting, or both.  Dizziness.  Pain with exposure to bright lights, loud noises, or activity.  General sensitivity to bright lights, loud noises, or smells. Before you get a migraine, you may get warning signs that a migraine is coming (aura). An aura may include:  Seeing flashing lights.  Seeing bright spots, halos, or zigzag lines.  Having tunnel vision or blurred vision.  Having feelings of numbness or tingling.  Having trouble talking.  Having muscle weakness. DIAGNOSIS  A migraine headache is often diagnosed based on:  Symptoms.  Physical exam.  A CT scan or MRI of your head. These imaging tests cannot diagnose migraines, but they can help rule out other causes of headaches. TREATMENT Medicines may be given for pain and nausea.  Medicines can also be given to help prevent recurrent migraines.  HOME CARE INSTRUCTIONS  Only take over-the-counter or prescription medicines for pain or discomfort as directed by your health care provider. The use of long-term narcotics is not recommended.  Lie down in a dark, quiet room when you have a migraine.  Keep a journal to find out what may trigger your migraine headaches. For example, write down:  What you eat and drink.  How much sleep you get.  Any change to your diet or medicines.  Limit alcohol consumption.  Quit smoking if you smoke.  Get 7-9 hours of sleep, or as recommended by your health care provider.  Limit stress.  Keep lights dim if bright lights bother you and make your migraines worse. SEEK IMMEDIATE MEDICAL CARE IF:   Your migraine becomes severe.  You have a fever.  You have a stiff neck.  You have vision loss.  You have muscular weakness or loss of muscle control.  You start losing your balance or have trouble walking.  You feel faint or pass out.  You have severe symptoms that are different from your first symptoms. MAKE SURE YOU:   Understand these instructions.  Will watch your condition.  Will get help right away if you are not doing well or get worse. Document Released: 05/22/2005 Document Revised: 10/06/2013 Document Reviewed: 01/27/2013 [employer] Patient Information 2015 [place]ExitCare, [place]LL[place]. This information is not intended to replace advice given to you by your health care provider. Make sure you discuss any questions you have with your health care provider.

## 2014-04-10 ENCOUNTER — Emergency Department (INDEPENDENT_AMBULATORY_CARE_PROVIDER_SITE_OTHER)
Admission: EM | Admit: 2014-04-10 | Discharge: 2014-04-10 | Disposition: A | Discharge status: Home / Self Care | Payer: Self-pay | Source: Home / Self Care | Attending: Family Medicine | Admitting: Family Medicine

## 2014-04-10 ENCOUNTER — Encounter (HOSPITAL_COMMUNITY): Payer: Self-pay | Admitting: Emergency Medicine

## 2014-04-10 DIAGNOSIS — M546 Pain in thoracic spine: Secondary | ICD-10-CM

## 2014-04-10 DIAGNOSIS — M549 Dorsalgia, unspecified: Secondary | ICD-10-CM

## 2014-04-10 MED ORDER — CYCLOBENZAPRINE HCL 10 MG PO TABS
10.0000 mg | ORAL_TABLET | Freq: Three times a day (TID) | ORAL | Status: DC | PRN
Start: 2014-04-10 — End: 2019-03-06

## 2014-04-10 MED ORDER — ACETAMINOPHEN-CODEINE #2 300-15 MG PO TABS: 1.0000 | ORAL_TABLET | ORAL | Status: DC | PRN

## 2014-04-10 MED ORDER — IBUPROFEN 800 MG PO TABS: 800.0000 mg | ORAL_TABLET | Freq: Three times a day (TID) | ORAL | Status: DC | PRN
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
## 2014-04-13 ENCOUNTER — Encounter (HOSPITAL_COMMUNITY): Payer: Self-pay | Admitting: Emergency Medicine

## 2014-04-13 ENCOUNTER — Emergency Department (INDEPENDENT_AMBULATORY_CARE_PROVIDER_SITE_OTHER)
Admission: EM | Admit: 2014-04-13 | Discharge: 2014-04-13 | Disposition: A | Discharge status: Home / Self Care | Payer: Self-pay | Source: Home / Self Care | Attending: Family Medicine | Admitting: Family Medicine

## 2014-04-13 DIAGNOSIS — M549 Dorsalgia, unspecified: Secondary | ICD-10-CM

## 2014-04-13 DIAGNOSIS — S46819A Strain of other muscles, fascia and tendons at shoulder and upper arm level, unspecified arm, initial encounter: Secondary | ICD-10-CM

## 2014-04-13 DIAGNOSIS — M546 Pain in thoracic spine: Secondary | ICD-10-CM

## 2014-04-13 MED ORDER — HYDROCODONE-ACETAMINOPHEN 5-325 MG PO TABS: 1.0000 | ORAL_TABLET | ORAL | Status: DC | PRN

## 2014-04-13 MED ORDER — KETOROLAC TROMETHAMINE 60 MG/2ML IM SOLN
INTRAMUSCULAR | Status: AC
  Filled 2014-04-13: qty 2

## 2014-04-13 MED ORDER — DICLOFENAC SODIUM 1 % TD GEL: 1.0000 "application " | Freq: Four times a day (QID) | TRANSDERMAL | Status: DC

## 2014-04-13 MED ORDER — KETOROLAC TROMETHAMINE 60 MG/2ML IM SOLN
60.0000 mg | Freq: Once | INTRAMUSCULAR | Status: AC
  Administered 2014-04-13: 60 mg via INTRAMUSCULAR

## 2014-04-13 MED ORDER — CARISOPRODOL 250 MG PO TABS: 250.0000 mg | ORAL_TABLET | Freq: Four times a day (QID) | ORAL | Status: DC
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
## 2014-04-13 NOTE — Discharge Instructions (Signed)
Muscle Strain Stop all previously prescribed medications.  The medications prescribed today can cause sedation and drowsiness. Do not work or drive while taking these. A muscle strain is an injury that occurs when a muscle is stretched beyond its normal length. Usually a small number of muscle fibers are torn when this happens. Muscle strain is rated in degrees. First-degree strains have the least amount of muscle fiber tearing and pain. Second-degree and third-degree strains have increasingly more tearing and pain.  Usually, recovery from muscle strain takes 1-2 weeks. Complete healing takes 5-6 weeks.  CAUSES  Muscle strain happens when a sudden, violent force placed on a muscle stretches it too far. This may occur with lifting, sports, or a fall.  RISK FACTORS Muscle strain is especially common in athletes.  SIGNS AND SYMPTOMS At the site of the muscle strain, there may be:  Pain.  Bruising.  Swelling.  Difficulty using the muscle due to pain or lack of normal function. DIAGNOSIS  Your health care provider will perform a physical exam and ask about your medical history. TREATMENT  Often, the best treatment for a muscle strain is resting, icing, and applying cold compresses to the injured area.  HOME CARE INSTRUCTIONS   Use the PRICE method of treatment to promote muscle healing during the first 2-3 days after your injury. The PRICE method involves:  Protecting the muscle from being injured again.  Restricting your activity and resting the injured body part.    Elevate the injured body part above the level of your heart as often as you can.  Only take over-the-counter or prescription medicines for pain, discomfort, or fever as directed by your health care provider.  Warming up prior to exercise helps to prevent future muscle strains. SEEK MEDICAL CARE IF:   You have increasing pain or swelling in the injured area.  You have numbness, tingling, or a significant loss of  strength in the injured area. MAKE SURE YOU:   Understand these instructions.  Will watch your condition.  Will get help right away if you are not doing well or get worse. Document Released: 05/22/2005 Document Revised: 03/12/2013 Document Reviewed: 12/19/2012 [facility] Patient Information 2015 [place]ExitCare, [place]. This information is not intended to replace advice given to you by your health care provider. Make sure you discuss any questions you have with your health care provider.  Muscle Strain A muscle strain is an injury that occurs when a muscle is stretched beyond its normal length. Usually a small number of muscle fibers are torn when this happens. Muscle strain is rated in degrees. First-degree strains have the least amount of muscle fiber tearing and pain. Second-degree and third-degree strains have increasingly more tearing and pain.  Usually, recovery from muscle strain takes 1-2 weeks. Complete healing takes 5-6 weeks.  CAUSES  Muscle strain happens when a sudden, violent force placed on a muscle stretches it too far. This may occur with lifting, sports, or a fall.  RISK FACTORS Muscle strain is especially common in athletes.  SIGNS AND SYMPTOMS At the site of the muscle strain, there may be:  Pain.  Bruising.  Swelling.  Difficulty using the muscle due to pain or lack of normal function. DIAGNOSIS  Your health care provider will perform a physical exam and ask about your medical history. TREATMENT  Often, the best treatment for a muscle strain is resting, icing, and applying cold compresses to the injured area.  HOME CARE INSTRUCTIONS   Use the PRICE method of treatment  to promote muscle healing during the first 2-3 days after your injury. The PRICE method involves:  Protecting the muscle from being injured again.  Restricting your activity and resting the injured body part.  Elevate the injured body part above the level of your heart as often as you can.  Only  take over-the-counter or prescription medicines for pain, discomfort, or fever as directed by your health care provider.  [employer] information centre managerMotor Vehicle Collision After a car crash (motor vehicle collision), it is normal to have bruises and sore muscles. The first 24 hours usually feel the worst. After that, you will likely start to feel better each day. HOME CARE Put ice on the injured area. Put ice in a plastic bag. Place a towel between your skin and the bag. Leave the ice on for 15-20 minutes, 03-04 times a day. Drink enough fluids to keep your pee (urine) clear or pale yellow. Do not drink alcohol. Take a warm shower or bath 1 or 2 times a day. This helps your sore muscles. Return to activities as told by your doctor. Be careful when lifting. Lifting can make neck or back pain worse. Only take medicine as told by your doctor. Do not use aspirin. GET HELP RIGHT AWAY IF:  Your arms or legs tingle, feel weak, or lose feeling (numbness). You have headaches that do not get better with medicine. You have neck pain, especially in the middle of the back of your neck. You cannot control when you pee (urinate) or poop (bowel movement). Pain is getting worse in any part of your body. You are short of breath, dizzy, or pass out (faint). You have chest pain. You feel sick to your stomach (nauseous), throw up (vomit), or sweat. You have belly (abdominal) pain that gets worse. There is blood in your pee, poop, or throw up. You have pain in your shoulder (shoulder strap areas). Your problems are getting worse. MAKE SURE YOU:  Understand these instructions. Will watch your condition. Will get help right away if you are not doing well or get worse. Document Released: 11/08/2007 Document Revised: 08/14/2011 Document Reviewed: 10/19/2010 [facility] Patient Information 2015 [place] CliffExitCare, [place]LLC. This information is not intended to replace advice given to you by your health care provider. Make sure you discuss any  questions you have with your health care provider.  ming up prior to exercise helps to prevent future muscle strains. SEEK MEDICAL CARE IF:   You have increasing pain or swelling in the injured area.  You have numbness, tingling, or a significant loss of strength in the injured area. MAKE SURE YOU:   Understand these instructions.  Will watch your condition.  Will get help right away if you are not doing well or get worse. Document Released: 05/22/2005 Document Revised: 03/12/2013 Document Reviewed: 12/19/2012 [facility] Patient Information 2015 [place]ExitCare, [place]LLC. This information is not intended to replace advice given to you by your health care provider. Make sure you discuss any questions you have with your health care provider.

## 2014-07-25 ENCOUNTER — Encounter (HOSPITAL_COMMUNITY): Payer: Self-pay | Admitting: *Deleted

## 2014-07-25 ENCOUNTER — Emergency Department (HOSPITAL_COMMUNITY)
Admission: EM | Admit: 2014-07-25 | Discharge: 2014-07-25 | Disposition: A | Discharge status: Home / Self Care | Payer: PRIVATE HEALTH INSURANCE | Source: Home / Self Care | Attending: Emergency Medicine | Admitting: Emergency Medicine

## 2014-07-25 DIAGNOSIS — G43009 Migraine without aura, not intractable, without status migrainosus: Secondary | ICD-10-CM

## 2014-07-25 MED ORDER — DEXAMETHASONE SODIUM PHOSPHATE 10 MG/ML IJ SOLN
INTRAMUSCULAR | Status: AC
  Filled 2014-07-25: qty 1

## 2014-07-25 MED ORDER — KETOROLAC TROMETHAMINE 30 MG/ML IJ SOLN
30.0000 mg | Freq: Once | INTRAMUSCULAR | Status: AC
  Administered 2014-07-25: 30 mg via INTRAMUSCULAR

## 2014-07-25 MED ORDER — KETOROLAC TROMETHAMINE 30 MG/ML IJ SOLN
INTRAMUSCULAR | Status: AC
  Filled 2014-07-25: qty 1

## 2014-07-25 MED ORDER — KETOROLAC TROMETHAMINE 30 MG/ML IJ SOLN: 30.0000 mg | Freq: Once | INTRAMUSCULAR | Status: DC

## 2014-07-25 MED ORDER — DEXAMETHASONE SODIUM PHOSPHATE 10 MG/ML IJ SOLN
10.0000 mg | Freq: Once | INTRAMUSCULAR | Status: AC
  Administered 2014-07-25: 10 mg via INTRAMUSCULAR

## 2014-07-25 NOTE — ED Notes (Signed)
Pt   Voiced a  Clarification  Of  Plan of  Care   Kathryn RasmussenDavid  [place]      Went  Back in  And  Talked  With  Her

## 2014-07-25 NOTE — ED Notes (Signed)
Pt  Has   A  History  Of  Migraines    -   She  Has  A  Headache  X  sev  Days      -  She  Reports  The  Symptoms  Not  releived  By  imitrex           Pt is  Sitting upright on the  Exam table  Speaking  In  Complete  sentances

## 2014-07-25 NOTE — Discharge Instructions (Signed)
Recurrent Migraine Headache Continue your Imetrex at home Rest , drink plenty of fluids. Call your doctor Monday for problems or continued recurring headaches. A migraine headache is an intense, throbbing pain on one or both sides of your head. Recurrent migraines keep coming back. A migraine can last for 30 minutes to several hours. CAUSES  The exact cause of a migraine headache is not always known. However, a migraine may be caused when nerves in the brain become irritated and release chemicals that cause inflammation. This causes pain. Certain things may also trigger migraines, such as:   Alcohol.  Smoking.  Stress.  Menstruation.  Aged cheeses.  Foods or drinks that contain nitrates, glutamate, aspartame, or tyramine.  Lack of sleep.  Chocolate.  Caffeine.  Hunger.  Physical exertion.  Fatigue.  Medicines used to treat chest pain (nitroglycerine), birth control pills, estrogen, and some blood pressure medicines. SYMPTOMS   Pain on one or both sides of your head.  Pulsating or throbbing pain.  Severe pain that prevents daily activities.  Pain that is aggravated by any physical activity.  Nausea, vomiting, or both.  Dizziness.  Pain with exposure to bright lights, loud noises, or activity.  General sensitivity to bright lights, loud noises, or smells. Before you get a migraine, you may get warning signs that a migraine is coming (aura). An aura may include:  Seeing flashing lights.  Seeing bright spots, halos, or zigzag lines.  Having tunnel vision or blurred vision.  Having feelings of numbness or tingling.  Having trouble talking.  Having muscle weakness. DIAGNOSIS  A recurrent migraine headache is often diagnosed based on:  Symptoms.  Physical examination.  A CT scan or MRI of your head. These imaging tests cannot diagnose migraines but can help rule out other causes of headaches.  TREATMENT  Medicines may be given for pain and nausea.  Medicines can also be given to help prevent recurrent migraines. HOME CARE INSTRUCTIONS  Only take over-the-counter or prescription medicines for pain or discomfort as directed by your health care provider. The use of long-term narcotics is not recommended.  Lie down in a dark, quiet room when you have a migraine.  Keep a journal to find out what may trigger your migraine headaches. For example, write down:  What you eat and drink.  How much sleep you get.  Any change to your diet or medicines.  Limit alcohol consumption.  Quit smoking if you smoke.  Get 7-9 hours of sleep, or as recommended by your health care provider.  Limit stress.  Keep lights dim if bright lights bother you and make your migraines worse. SEEK MEDICAL CARE IF:   You do not get relief from the medicines given to you.  You have a recurrence of pain.  You have a fever. SEEK IMMEDIATE MEDICAL CARE IF:  Your migraine becomes severe.  You have a stiff neck.  You have loss of vision.  You have muscular weakness or loss of muscle control.  You start losing your balance or have trouble walking.  You feel faint or pass out.  You have severe symptoms that are different from your first symptoms. MAKE SURE YOU:   Understand these instructions.  Will watch your condition.  Will get help right away if you are not doing well or get worse. Document Released: 02/14/2001 Document Revised: 10/06/2013 Document Reviewed: 01/27/2013 [facility] LPExitCare Patient Information 2015 [place]ExitCare, [place]LLC. This information is not intended to replace advice given to you by your health care provider. Make  sure you discuss any questions you have with your health care provider.

## 2014-07-25 NOTE — ED Provider Notes (Addendum)
CSN: [id number]     Arrival date & time 07/25/14  1317 History   First MD Initiated Contact with Patient 07/25/14 1403     Chief Complaint  Patient presents with  . Migraine   (Consider location/radiation/quality/duration/timing/severity/associated sxs/prior Treatment) HPI Comments: 39 year old obese female complaining of a migraine headache. She has been seen 3-4 times in the urgent care in the past few months for migraine headache. Her description of her headache and symptoms are quite similar to past descriptions. (Her previous visits were reviewed for symptomatolgy patterns, treatments and responses to treatments.) She is complaining of pain primarily over the left periorbital and frontal areas. It is associated with photophobia. When she stands up she sees spots in her eyes then they resolved. She denies nausea or vomiting. Denies problems with vision, speech, hearing, swallowing, focal paresthesias or weakness. Her speech is clear, fluid and goal oriented. Denies chest pain or shortness of breath. She comments that this headache is on a whole new different level. When attempting to ask her what was different she was unable to offer any new symptoms. It turns out it is just worse than most migraine headaches. She is sitting on the table and conversing with rapid speech.   Past Medical History  Diagnosis Date  . Hypertension   . Migraines    Past Surgical History  Procedure Laterality Date  . Cesarean section    . Cesarean section     History reviewed. No pertinent family history. History  Substance Use Topics  . Smoking status: Never Smoker   . Smokeless tobacco: Never Used  . Alcohol Use: No   OB History    No data available     Review of Systems  Constitutional: Positive for activity change. Negative for fever and fatigue.  HENT: Negative for postnasal drip, rhinorrhea and sore throat.   Eyes: Positive for photophobia.  Respiratory: Negative for cough, chest tightness,  shortness of breath and wheezing.   Cardiovascular: Negative for chest pain, palpitations and leg swelling.  Gastrointestinal: Negative.   Genitourinary: Negative.   Neurological: Positive for dizziness and headaches. Negative for tremors, seizures, syncope, speech difficulty, weakness and numbness.  Psychiatric/Behavioral: Positive for sleep disturbance.    Allergies  Review of patient's allergies indicates no known allergies.  Home Medications   Prior to Admission medications   Medication Sig Start Date End Date Taking? Authorizing Provider  acetaminophen (TYLENOL) 500 MG tablet Take 1,000 mg by mouth every 6 (six) hours as needed for headache.    Historical Provider, MD  acetaminophen-codeine (TYLENOL #2) 300-15 MG per tablet Take 1 tablet by mouth every 4 (four) hours as needed for moderate pain. 04/10/14   Graylon Good, PA-C  aspirin-acetaminophen-caffeine (EXCEDRIN MIGRAINE) [phone] MG per tablet Take 2 tablets by mouth every 6 (six) hours as needed. For migraine    Historical Provider, MD  buPROPion (WELLBUTRIN) 75 MG tablet Take 75 mg by mouth 2 (two) times daily.    Historical Provider, MD  carisoprodol (SOMA) 250 MG tablet Take 1 tablet (250 mg total) by mouth 4 (four) times daily. Prn muscle spasms. 04/13/14   Hayden Rasmussen, NP  cyclobenzaprine (FLEXERIL) 10 MG tablet Take 1 tablet (10 mg total) by mouth 3 (three) times daily as needed for muscle spasms. 04/10/14   Graylon Good, PA-C  diclofenac sodium (VOLTAREN) 1 % GEL Apply 1 application topically 4 (four) times daily. 04/13/14   Hayden Rasmussen, NP  ferrous sulfate 325 (65 FE) MG tablet Take 325 mg  by mouth daily with breakfast.    Historical Provider, MD  HYDROcodone-acetaminophen (NORCO/VICODIN) 5-325 MG per tablet Take 1 tablet by mouth every 4 (four) hours as needed. 04/13/14   Hayden Rasmussen, NP  ibuprofen (ADVIL,MOTRIN) 800 MG tablet Take 1 tablet (800 mg total) by mouth every 8 (eight) hours as needed. 04/10/14   Graylon Good,  PA-C  lisinopril-hydrochlorothiazide (PRINZIDE,ZESTORETIC) 20-25 MG per tablet Take 1 tablet by mouth daily.    Historical Provider, MD  medroxyPROGESTERone (DEPO-SUBQ PROVERA) 104 MG/0.65ML injection Inject 104 mg into the skin every 3 (three) months.    Historical Provider, MD  ondansetron (ZOFRAN) 4 MG tablet Take 1 tablet (4 mg total) by mouth every 8 (eight) hours as needed for nausea or vomiting. 12/04/13   Mathis Fare Presson, PA  SUMAtriptan (IMITREX) 100 MG tablet Take 1 tablet (100 mg total) by mouth every 2 (two) hours as needed for migraine or headache. May repeat in 2 hours if headache persists or recurs. 11/01/13   Riki Sheer, PA-C   BP 123/69 mmHg  Pulse 112  Temp(Src) 98 F (36.7 C) (Oral)  Resp 16  SpO2 95%  LMP 07/05/2014 Physical Exam  Constitutional: She is oriented to person, place, and time. She appears well-developed and well-nourished. No distress.  Does not appear toxic or acutely ill. Often smiling and in no apparent distress.  HENT:  Mouth/Throat: Oropharynx is clear and moist. No oropharyngeal exudate.  Bilateral TMs are normal. No effusion, retraction or erythema.  Eyes: Conjunctivae and EOM are normal. Pupils are equal, round, and reactive to light. Right eye exhibits no discharge. Left eye exhibits no discharge.  Neck: Normal range of motion. Neck supple.  Cardiovascular: Normal rate, regular rhythm, normal heart sounds and intact distal pulses.   Pulmonary/Chest: Effort normal and breath sounds normal. No respiratory distress. She has no wheezes. She has no rales.  Musculoskeletal: Normal range of motion. She exhibits no edema or tenderness.  Lymphadenopathy:    She has no cervical adenopathy.  Neurological: She is alert and oriented to person, place, and time. She has normal strength. She displays no tremor. No cranial nerve deficit or sensory deficit. She exhibits normal muscle tone. She displays no seizure activity. Coordination and gait normal.   Skin: Skin is warm and dry.  Psychiatric: She has a normal mood and affect.  Nursing note and vitals reviewed.   ED Course  Procedures (including critical care time) Labs Review Labs Reviewed - No data to display  Imaging Review No results found.   MDM   1. Migraine without aura and without status migrainosus, not intractable    Decadron 10 mg Im  Toradol 30 mg IM  Continue your Imetrex at home Rest , drink plenty of fluids. Call your doctor Monday for problems or continued recurring headaches.   Pt later st she was not satisfied with this visit stating that "Your mind was already made up as to what you were going to do before you came in." I had reviewed documentation from previous encounters for migraine presentations and managements.  I had discussed with her previous symptoms and treatments from prior visits and compared those to her current ones obtained after the exam. I performed a gross neuro and additional physical exams for this visit as well as obtain her HPI. When discussing treatments from prior visits I reminded her that she had at least partial improvement from the some of those treatments. When I advised her that I was planning  to order similar medications that she had in the past including the injections she stated that she was scared of needles. This statement was in direct conflict with her previous visits as she had received injections for treatment and there was no documentation of needle phobia or refusal to have parenteral therapy. I again related that she had improvement with these in the past and asked her to reconsider. She hesitated, then agreed to having the injections. There are few alternatives, benadryl sometimes helps but she is driving. She has Imitrex. I generally do not tx migraines with narcotics. She had no nausea or GI complaints. This is the basis of her dissatisfaction the best I can determine. "It is my job to please the customer and I did not do  that". No other specifics were expressed by the pt and she seemed to have had some difficulty explaining how I could have done things differently to have made this a more pleasant visit for her. I apologized that I did not meet her expectations, (whatever they may have been), and she had no further comments at that time.   Hayden Rasmussenavid Torianna Junio, NP 07/25/14 1427  Hayden Rasmussenavid Romir Klimowicz, NP 07/25/14 [id number]

## 2014-07-31 ENCOUNTER — Emergency Department (HOSPITAL_COMMUNITY): Payer: No Typology Code available for payment source

## 2014-07-31 ENCOUNTER — Emergency Department (HOSPITAL_COMMUNITY)
Admission: EM | Admit: 2014-07-31 | Discharge: 2014-07-31 | Disposition: A | Discharge status: Home / Self Care | Payer: No Typology Code available for payment source | Attending: Emergency Medicine | Admitting: Emergency Medicine

## 2014-07-31 ENCOUNTER — Encounter (HOSPITAL_COMMUNITY): Payer: Self-pay | Admitting: Emergency Medicine

## 2014-07-31 DIAGNOSIS — I10 Essential (primary) hypertension: Secondary | ICD-10-CM | POA: Insufficient documentation

## 2014-07-31 DIAGNOSIS — S42352A Displaced comminuted fracture of shaft of humerus, left arm, initial encounter for closed fracture: Secondary | ICD-10-CM | POA: Insufficient documentation

## 2014-07-31 DIAGNOSIS — Y92002 Bathroom of unspecified non-institutional (private) residence single-family (private) house as the place of occurrence of the external cause: Secondary | ICD-10-CM | POA: Insufficient documentation

## 2014-07-31 DIAGNOSIS — Y998 Other external cause status: Secondary | ICD-10-CM | POA: Diagnosis not present

## 2014-07-31 DIAGNOSIS — S4992XA Unspecified injury of left shoulder and upper arm, initial encounter: Secondary | ICD-10-CM | POA: Diagnosis present

## 2014-07-31 DIAGNOSIS — S42302A Unspecified fracture of shaft of humerus, left arm, initial encounter for closed fracture: Secondary | ICD-10-CM

## 2014-07-31 DIAGNOSIS — W010XXA Fall on same level from slipping, tripping and stumbling without subsequent striking against object, initial encounter: Secondary | ICD-10-CM | POA: Diagnosis not present

## 2014-07-31 DIAGNOSIS — G43909 Migraine, unspecified, not intractable, without status migrainosus: Secondary | ICD-10-CM | POA: Insufficient documentation

## 2014-07-31 DIAGNOSIS — Y9389 Activity, other specified: Secondary | ICD-10-CM | POA: Insufficient documentation

## 2014-07-31 DIAGNOSIS — Z79899 Other long term (current) drug therapy: Secondary | ICD-10-CM | POA: Insufficient documentation

## 2014-07-31 MED ORDER — CYCLOBENZAPRINE HCL 10 MG PO TABS
5.0000 mg | ORAL_TABLET | Freq: Once | ORAL | Status: AC
  Administered 2014-07-31: 5 mg via ORAL
  Filled 2014-07-31: qty 1

## 2014-07-31 MED ORDER — HYDROMORPHONE HCL 1 MG/ML IJ SOLN
1.0000 mg | Freq: Once | INTRAMUSCULAR | Status: AC
  Administered 2014-07-31: 1 mg via INTRAMUSCULAR
  Filled 2014-07-31: qty 1

## 2014-07-31 MED ORDER — OXYCODONE-ACETAMINOPHEN 10-325 MG PO TABS: 1.0000 | ORAL_TABLET | ORAL | Status: DC | PRN

## 2014-07-31 MED ORDER — ONDANSETRON HCL 4 MG PO TABS: 4.0000 mg | ORAL_TABLET | Freq: Four times a day (QID) | ORAL | Status: DC

## 2014-07-31 MED ORDER — CYCLOBENZAPRINE HCL 10 MG PO TABS: 10.0000 mg | ORAL_TABLET | Freq: Two times a day (BID) | ORAL | Status: DC | PRN

## 2014-07-31 MED ORDER — ONDANSETRON 4 MG PO TBDP
4.0000 mg | ORAL_TABLET | Freq: Once | ORAL | Status: AC
  Administered 2014-07-31: 4 mg via ORAL
  Filled 2014-07-31: qty 1

## 2014-07-31 MED ORDER — IBUPROFEN 800 MG PO TABS
800.0000 mg | ORAL_TABLET | Freq: Once | ORAL | Status: AC
  Administered 2014-07-31: 800 mg via ORAL
  Filled 2014-07-31: qty 1

## 2014-07-31 NOTE — ED Provider Notes (Signed)
CSN: [id number]     Arrival date & time 07/31/14  4098 History   First MD Initiated Contact with Patient 07/31/14 1008     Chief Complaint  Patient presents with  . Arm Pain     (Consider location/radiation/quality/duration/timing/severity/associated sxs/prior Treatment) HPI    PCP: No PCP Per Patient Blood pressure 100/54, pulse 120, temperature 97.9 F (36.6 C), temperature source Oral, resp. rate 20, last menstrual period 07/05/2014, SpO2 100 %.  Kathryn Middleton is a 39 y.o.female with a significant PMH of hypertension and migraines presents to the ER with complaints of left arm injury. She was getting out of the shower this morning when her foot was caught on the side of the tub and she slipped falling on her left elbow. She immediately had pain to the left arm. It has been approximately 3 hours since the fall, she last ate at 7 am this morning. Since then she has been unable to move her arm and is having excruciating pain.   Negative Review of Symptoms: head injury, loc, neck pain, abdominal pain, chest pain, back pain.    Past Medical History  Diagnosis Date  . Hypertension   . Migraines    Past Surgical History  Procedure Laterality Date  . Cesarean section    . Cesarean section     No family history on file. History  Substance Use Topics  . Smoking status: Never Smoker   . Smokeless tobacco: Never Used  . Alcohol Use: No   OB History    No data available     Review of Systems  10 Systems reviewed and are negative for acute change except as noted in the HPI.   Allergies  Review of patient's allergies indicates no known allergies.  Home Medications   Prior to Admission medications   Medication Sig Start Date End Date Taking? Authorizing Provider  aspirin-acetaminophen-caffeine (EXCEDRIN MIGRAINE) [phone] MG per tablet Take 2 tablets by mouth every 6 (six) hours as needed. For migraine   Yes Historical Provider, MD  buPROPion (WELLBUTRIN) 75 MG tablet  Take 75 mg by mouth 2 (two) times daily.   Yes Historical Provider, MD  ferrous sulfate 325 (65 FE) MG tablet Take 325 mg by mouth daily with breakfast.   Yes Historical Provider, MD  lisinopril-hydrochlorothiazide (PRINZIDE,ZESTORETIC) 20-25 MG per tablet Take 1 tablet by mouth daily.   Yes Historical Provider, MD  SUMAtriptan (IMITREX) 100 MG tablet Take 1 tablet (100 mg total) by mouth every 2 (two) hours as needed for migraine or headache. May repeat in 2 hours if headache persists or recurs. 11/01/13  Yes Riki Sheer, PA-C  acetaminophen (TYLENOL) 500 MG tablet Take 1,000 mg by mouth every 6 (six) hours as needed for headache.    Historical Provider, MD  acetaminophen-codeine (TYLENOL #2) 300-15 MG per tablet Take 1 tablet by mouth every 4 (four) hours as needed for moderate pain. Patient not taking: Reported on 07/31/2014 04/10/14   Graylon Good, PA-C  carisoprodol (SOMA) 250 MG tablet Take 1 tablet (250 mg total) by mouth 4 (four) times daily. Prn muscle spasms. Patient not taking: Reported on 07/31/2014 04/13/14   Hayden Rasmussen, NP  cyclobenzaprine (FLEXERIL) 10 MG tablet Take 1 tablet (10 mg total) by mouth 3 (three) times daily as needed for muscle spasms. Patient not taking: Reported on 07/31/2014 04/10/14   Graylon Good, PA-C  diclofenac sodium (VOLTAREN) 1 % GEL Apply 1 application topically 4 (four) times daily. Patient not taking: Reported  on 07/31/2014 04/13/14   Hayden Rasmussen, NP  HYDROcodone-acetaminophen (NORCO/VICODIN) 5-325 MG per tablet Take 1 tablet by mouth every 4 (four) hours as needed. Patient not taking: Reported on 07/31/2014 04/13/14   Hayden Rasmussen, NP  ibuprofen (ADVIL,MOTRIN) 800 MG tablet Take 1 tablet (800 mg total) by mouth every 8 (eight) hours as needed. Patient not taking: Reported on 07/31/2014 04/10/14   Graylon Good, PA-C  medroxyPROGESTERone (DEPO-SUBQ PROVERA) 104 MG/0.65ML injection Inject 104 mg into the skin every 3 (three) months.    Historical Provider, MD   ondansetron (ZOFRAN) 4 MG tablet Take 1 tablet (4 mg total) by mouth every 8 (eight) hours as needed for nausea or vomiting. Patient not taking: Reported on 07/31/2014 12/04/13   Mathis Fare Presson, PA   BP 103/42 mmHg  Pulse 104  Temp(Src) 97.9 F (36.6 C) (Oral)  Resp 18  SpO2 100%  LMP 07/05/2014 Physical Exam  Constitutional: She appears well-developed and well-nourished. No distress.  HENT:  Head: Normocephalic and atraumatic.  Eyes: Pupils are equal, round, and reactive to light.  Neck: Normal range of motion. Neck supple. No spinous process tenderness present. Normal range of motion present. No Brudzinski's sign and no Kernig's sign noted.  Cardiovascular: Normal rate and regular rhythm.   Pulmonary/Chest: Effort normal and breath sounds normal. She exhibits no tenderness and no bony tenderness.  Abdominal: Soft.  Musculoskeletal:       Left upper arm: She exhibits tenderness, bony tenderness, swelling, edema and deformity. She exhibits no laceration.  The patient has full range of motion of all 5 fingers and sensations are intact. Her radial pulse are symmetrical. Her skin is warm and moist. I see no skin tenting, lacerations, rashes.   Neurological: She is alert.  Skin: Skin is warm and dry.  Nursing note and vitals reviewed.   ED Course  Procedures (including critical care time) Labs Review Labs Reviewed  CBG MONITORING, ED    Imaging Review Dg Humerus Left  07/31/2014   CLINICAL DATA:  Fall today.  Initial encounter  EXAM: LEFT HUMERUS - 2+ VIEW  COMPARISON:  None.  FINDINGS: Comminuted fracture mid humerus with 1/2 shaft with of displacement and a large fracture fragment. Shoulder grossly normal. Elbow not imaged.  IMPRESSION: Comminuted displaced fracture mid humerus.   Electronically Signed   By: Marlan Palau M.D.   On: 07/31/2014 10:55     EKG Interpretation None      MDM   Final diagnoses:  Fracture, humerus closed, shaft, left, initial encounter     11:20pm I spoke with Dr. Magnus Ivan regarding the fracture, he looked at the xray. He recommends a special immobilizing splint and shoulder immobilizer. She is to call the office as soon as possible to arrange f/u appointment.  Medications  HYDROmorphone (DILAUDID) injection 1 mg (1 mg Intramuscular Given 07/31/14 1131)  ondansetron (ZOFRAN-ODT) disintegrating tablet 4 mg (4 mg Oral Given 07/31/14 1131)  HYDROmorphone (DILAUDID) injection 1 mg (1 mg Intramuscular Given 07/31/14 1249)  cyclobenzaprine (FLEXERIL) tablet 5 mg (5 mg Oral Given 07/31/14 1246)  ibuprofen (ADVIL,MOTRIN) tablet 800 mg (800 mg Oral Given 07/31/14 1246)    1:07pm The patient has been splinted and pain managed in the ER. Will rx pain and nausea medications and well as muscle relaxers. She will call the office to arrange follow-up.  39 y.o.Kathryn Middleton's evaluation in the Emergency Department is complete. It has been determined that no acute conditions requiring further emergency intervention are present at this  time. The patient/guardian have been advised of the diagnosis and plan. We have discussed signs and symptoms that warrant return to the ED, such as changes or worsening in symptoms.  Vital signs are stable at discharge. Filed Vitals:   07/31/14 1248  BP: 103/42  Pulse: 104  Temp:   Resp: 18    Patient/guardian has voiced understanding and agreed to follow-up with the PCP or specialist.    Dorthula Matasiffany G Bertha Lokken, PA-C 07/31/14 1308  Benny LennertJoseph L Zammit, MD 07/31/14 [phone]

## 2014-07-31 NOTE — ED Notes (Signed)
Per PTAR: Pt was getting out the shower this morning and slipped.  Fell onto lt elbow and is now having lt upper arm pain.  PTAR notes crepitus.

## 2014-07-31 NOTE — ED Notes (Signed)
Bed: [id number] Expected date:  Expected time:  Means of arrival:  Comments: HOLD triage 7

## 2014-07-31 NOTE — ED Notes (Signed)
Ortho at bedside.

## 2014-07-31 NOTE — Progress Notes (Signed)
Pt extubated to 2L nasal cannula. Vital signs are stable. 

## 2014-07-31 NOTE — ED Notes (Signed)
Pt alert, oriented, and discharged in the care of husband via wheelchair. She was advised to call orthopedics for appt in week.

## 2014-07-31 NOTE — Discharge Instructions (Signed)
Cast or Splint Care °Casts and splints support injured limbs and keep bones from moving while they heal. It is important to care for your cast or splint at home.   °HOME CARE INSTRUCTIONS °· Keep the cast or splint uncovered during the drying period. It can take 24 to 48 hours to dry if it is made of plaster. A fiberglass cast will dry in less than 1 hour. °· Do not rest the cast on anything harder than a pillow for the first 24 hours. °· Do not put weight on your injured limb or apply pressure to the cast until your health care provider gives you permission. °· Keep the cast or splint dry. Wet casts or splints can lose their shape and may not support the limb as well. A wet cast that has lost its shape can also create harmful pressure on your skin when it dries. Also, wet skin can become infected. °¨ Cover the cast or splint with a plastic bag when bathing or when out in the rain or snow. If the cast is on the trunk of the body, take sponge baths until the cast is removed. °¨ If your cast does become wet, dry it with a towel or a blow dryer on the cool setting only. °· Keep your cast or splint clean. Soiled casts may be wiped with a moistened cloth. °· Do not place any hard or soft foreign objects under your cast or splint, such as cotton, toilet paper, lotion, or powder. °· Do not try to scratch the skin under the cast with any object. The object could get stuck inside the cast. Also, scratching could lead to an infection. If itching is a problem, use a blow dryer on a cool setting to relieve discomfort. °· Do not trim or cut your cast or remove padding from inside of it. °· Exercise all joints next to the injury that are not immobilized by the cast or splint. For example, if you have a long leg cast, exercise the hip joint and toes. If you have an arm cast or splint, exercise the shoulder, elbow, thumb, and fingers. °· Elevate your injured arm or leg on 1 or 2 pillows for the first 1 to 3 days to decrease  swelling and pain. It is best if you can comfortably elevate your cast so it is higher than your heart. °SEEK MEDICAL CARE IF:  °· Your cast or splint cracks. °· Your cast or splint is too tight or too loose. °· You have unbearable itching inside the cast. °· Your cast becomes wet or develops a soft spot or area. °· You have a bad smell coming from inside your cast. °· You get an object stuck under your cast. °· Your skin around the cast becomes red or raw. °· You have new pain or worsening pain after the cast has been applied. °SEEK IMMEDIATE MEDICAL CARE IF:  °· You have fluid leaking through the cast. °· You are unable to move your fingers or toes. °· You have discolored (blue or white), cool, painful, or very swollen fingers or toes beyond the cast. °· You have tingling or numbness around the injured area. °· You have severe pain or pressure under the cast. °· You have any difficulty with your breathing or have shortness of breath. °· You have chest pain. °Document Released: 05/19/2000 Document Revised: 03/12/2013 Document Reviewed: 11/28/2012 °[employer]® Patient Information ©2015 [employer]. This information is not intended to replace advice given to you by your health care   provider. Make sure you discuss any questions you have with your health care provider. °Humerus Fracture, Treated with Immobilization °The humerus is the large bone in your upper arm. You have a broken (fractured) humerus. These fractures are easily diagnosed with X-rays. °TREATMENT  °Simple fractures which will heal without disability are treated with simple immobilization. Immobilization means you will wear a cast, splint, or sling. You have a fracture which will do well with immobilization. The fracture will heal well simply by being held in a good position until it is stable enough to begin range of motion exercises. Do not take part in activities which would further injure your arm.  °HOME CARE INSTRUCTIONS  °· Put ice on the injured  area. °¨ Put ice in a plastic bag. °¨ Place a towel between your skin and the bag. °¨ Leave the ice on for 15-20 minutes, 03-04 times a day. °· If you have a cast: °¨ Do not scratch the skin under the cast using sharp or pointed objects. °¨ Check the skin around the cast every day. You may put lotion on any red or sore areas. °¨ Keep your cast dry and clean. °· If you have a splint: °¨ Wear the splint as directed. °¨ Keep your splint dry and clean. °¨ You may loosen the elastic around the splint if your fingers become numb, tingle, or turn cold or blue. °· If you have a sling: °¨ Wear the sling as directed. °· Do not put pressure on any part of your cast or splint until it is fully hardened. °· Your cast or splint can be protected during bathing with a plastic bag. Do not lower the cast or splint into water. °· Only take over-the-counter or prescription medicines for pain, discomfort, or fever as directed by your caregiver. °· Do range of motion exercises as instructed by your caregiver. °· Follow up as directed by your caregiver. This is very important in order to avoid permanent injury or disability and chronic pain. °SEEK IMMEDIATE MEDICAL CARE IF:  °· Your skin or nails in the injured arm turn blue or gray. °· Your arm feels cold or numb. °· You develop severe pain in the injured arm. °· You are having problems with the medicines you were given. °MAKE SURE YOU:  °· Understand these instructions. °· Will watch your condition. °· Will get help right away if you are not doing well or get worse. °Document Released: 08/28/2000 Document Revised: 08/14/2011 Document Reviewed: 07/06/2010 °Exit[employer]® Patient Information ©2015 [employer]. This information is not intended to replace advice given to you by your health care provider. Make sure you discuss any questions you have with your health care provider. ° °

## 2014-07-31 NOTE — ED Notes (Signed)
PA at bedside.

## 2014-08-01 LAB — CBG MONITORING, ED: Glucose-Capillary: 102 mg/dL — ABNORMAL HIGH (ref 70–99)

## 2016-05-12 ENCOUNTER — Other Ambulatory Visit: Payer: Self-pay | Admitting: Obstetrics and Gynecology

## 2016-05-16 LAB — CYTOLOGY - PAP

## 2016-06-24 ENCOUNTER — Encounter (HOSPITAL_COMMUNITY): Payer: Self-pay | Admitting: *Deleted

## 2016-06-24 ENCOUNTER — Emergency Department (HOSPITAL_COMMUNITY)
Admission: EM | Admit: 2016-06-24 | Discharge: 2016-06-25 | Disposition: A | Discharge status: Home / Self Care | Payer: BLUE CROSS/BLUE SHIELD | Attending: Emergency Medicine | Admitting: Emergency Medicine

## 2016-06-24 DIAGNOSIS — Z7982 Long term (current) use of aspirin: Secondary | ICD-10-CM | POA: Insufficient documentation

## 2016-06-24 DIAGNOSIS — B9789 Other viral agents as the cause of diseases classified elsewhere: Secondary | ICD-10-CM

## 2016-06-24 DIAGNOSIS — R509 Fever, unspecified: Secondary | ICD-10-CM | POA: Diagnosis present

## 2016-06-24 DIAGNOSIS — Z79899 Other long term (current) drug therapy: Secondary | ICD-10-CM | POA: Diagnosis not present

## 2016-06-24 DIAGNOSIS — J069 Acute upper respiratory infection, unspecified: Secondary | ICD-10-CM

## 2016-06-24 DIAGNOSIS — I10 Essential (primary) hypertension: Secondary | ICD-10-CM | POA: Insufficient documentation

## 2016-06-24 MED ORDER — ONDANSETRON 4 MG PO TBDP: 4.0000 mg | ORAL_TABLET | Freq: Once | ORAL | Status: DC | PRN

## 2016-06-24 MED ORDER — ONDANSETRON 4 MG PO TBDP
ORAL_TABLET | ORAL | Status: AC
  Administered 2016-06-24: 4 mg
  Filled 2016-06-24: qty 1

## 2016-06-24 MED ORDER — SODIUM CHLORIDE 0.9 % IV BOLUS (SEPSIS)
1000.0000 mL | Freq: Once | INTRAVENOUS | Status: AC
Start: 2016-06-24 — End: 2016-06-25
  Administered 2016-06-25: 1000 mL via INTRAVENOUS

## 2016-06-24 MED ORDER — ACETAMINOPHEN 500 MG PO TABS: 1000.0000 mg | ORAL_TABLET | Freq: Once | ORAL | Status: DC

## 2016-06-24 MED ORDER — DIPHENHYDRAMINE HCL 50 MG/ML IJ SOLN
50.0000 mg | Freq: Once | INTRAMUSCULAR | Status: AC
  Administered 2016-06-25: 50 mg via INTRAVENOUS
  Filled 2016-06-24: qty 1

## 2016-06-24 MED ORDER — METOCLOPRAMIDE HCL 5 MG/ML IJ SOLN
10.0000 mg | Freq: Once | INTRAMUSCULAR | Status: AC
  Administered 2016-06-25: 10 mg via INTRAVENOUS
  Filled 2016-06-24: qty 2

## 2016-06-24 MED ORDER — OXYCODONE-ACETAMINOPHEN 5-325 MG PO TABS
ORAL_TABLET | ORAL | Status: AC
  Filled 2016-06-24: qty 1

## 2016-06-24 MED ORDER — OXYCODONE-ACETAMINOPHEN 5-325 MG PO TABS
1.0000 | ORAL_TABLET | ORAL | Status: DC | PRN
  Administered 2016-06-24: 1 via ORAL

## 2016-06-24 MED ORDER — ACETAMINOPHEN 500 MG PO TABS
1000.0000 mg | ORAL_TABLET | Freq: Once | ORAL | Status: AC
  Administered 2016-06-25: 1000 mg via ORAL
  Filled 2016-06-24: qty 2

## 2016-06-24 MED ORDER — KETOROLAC TROMETHAMINE 30 MG/ML IJ SOLN
30.0000 mg | Freq: Once | INTRAMUSCULAR | Status: AC
  Administered 2016-06-25: 30 mg via INTRAVENOUS
  Filled 2016-06-24: qty 1

## 2016-06-24 NOTE — ED Provider Notes (Signed)
[facility]-EMERGENCY DEPT Provider Note   CSN: [id number] Arrival date & time: 06/24/16  1935   By signing my name below, I, Clarisse Gouge, attest that this documentation has been prepared under the direction and in the presence of Tomasita Crumble, MD. Electronically signed, Clarisse Gouge, ED Scribe. 06/24/16. 12:40 AM.   History   Chief Complaint Chief Complaint  Patient presents with  . Fever  . Chills  . Cough  . Emesis   The history is provided by the patient and medical records. No language interpreter was used.    HPI Comments: Kathryn Middleton is a 41 y.o. female with Hx of migraines who presents to the Emergency Department complaining of sudden onset fever and cough x 2-3 days. She reports associated N/V, weakness, headache, photophobia, lightheadedness and wheezing. Pt states she has taken alka seltzer cold & flu, robitussin, nyquil, Excedrin and imitrex without relief. Notes she was given percocet in triage, which provided mild relief to her headache, but her headache has returned on evaluation. Pt denies sick contacts recently. She is concerned she may have pneumonia on top of her flu.  Past Medical History:  Diagnosis Date  . Hypertension   . Migraines     There are no active problems to display for this patient.   Past Surgical History:  Procedure Laterality Date  . CESAREAN SECTION    . CESAREAN SECTION      OB History    No data available       Home Medications    Prior to Admission medications   Medication Sig Start Date End Date Taking? Authorizing Provider  acetaminophen (TYLENOL) 500 MG tablet Take 1,000 mg by mouth every 6 (six) hours as needed for headache.    Historical Provider, MD  acetaminophen-codeine (TYLENOL #2) 300-15 MG per tablet Take 1 tablet by mouth every 4 (four) hours as needed for moderate pain. Patient not taking: Reported on 07/31/2014 04/10/14   Graylon Good, PA-C  aspirin-acetaminophen-caffeine (EXCEDRIN MIGRAINE) [phone] MG  per tablet Take 2 tablets by mouth every 6 (six) hours as needed. For migraine    Historical Provider, MD  buPROPion (WELLBUTRIN) 75 MG tablet Take 75 mg by mouth 2 (two) times daily.    Historical Provider, MD  carisoprodol (SOMA) 250 MG tablet Take 1 tablet (250 mg total) by mouth 4 (four) times daily. Prn muscle spasms. Patient not taking: Reported on 07/31/2014 04/13/14   Hayden Rasmussen, NP  cyclobenzaprine (FLEXERIL) 10 MG tablet Take 1 tablet (10 mg total) by mouth 3 (three) times daily as needed for muscle spasms. Patient not taking: Reported on 07/31/2014 04/10/14   Graylon Good, PA-C  cyclobenzaprine (FLEXERIL) 10 MG tablet Take 1 tablet (10 mg total) by mouth 2 (two) times daily as needed for muscle spasms. 07/31/14   Marlon Pel, PA-C  diclofenac sodium (VOLTAREN) 1 % GEL Apply 1 application topically 4 (four) times daily. Patient not taking: Reported on 07/31/2014 04/13/14   Hayden Rasmussen, NP  ferrous sulfate 325 (65 FE) MG tablet Take 325 mg by mouth daily with breakfast.    Historical Provider, MD  HYDROcodone-acetaminophen (NORCO/VICODIN) 5-325 MG per tablet Take 1 tablet by mouth every 4 (four) hours as needed. Patient not taking: Reported on 07/31/2014 04/13/14   Hayden Rasmussen, NP  ibuprofen (ADVIL,MOTRIN) 800 MG tablet Take 1 tablet (800 mg total) by mouth every 8 (eight) hours as needed. Patient not taking: Reported on 07/31/2014 04/10/14   Graylon Good, PA-C  lisinopril-hydrochlorothiazide (PRINZIDE,ZESTORETIC) 20-25  MG per tablet Take 1 tablet by mouth daily.    Historical Provider, MD  medroxyPROGESTERone (DEPO-SUBQ PROVERA) 104 MG/0.65ML injection Inject 104 mg into the skin every 3 (three) months.    Historical Provider, MD  ondansetron (ZOFRAN) 4 MG tablet Take 1 tablet (4 mg total) by mouth every 8 (eight) hours as needed for nausea or vomiting. Patient not taking: Reported on 07/31/2014 12/04/13   Mathis Fare Presson, PA  ondansetron (ZOFRAN) 4 MG tablet Take 1 tablet (4 mg total) by  mouth every 6 (six) hours. 07/31/14   Marlon Pel, PA-C  oxyCODONE-acetaminophen (PERCOCET) 10-325 MG per tablet Take 1 tablet by mouth every 4 (four) hours as needed for pain. 07/31/14   Marlon Pel, PA-C  SUMAtriptan (IMITREX) 100 MG tablet Take 1 tablet (100 mg total) by mouth every 2 (two) hours as needed for migraine or headache. May repeat in 2 hours if headache persists or recurs. 11/01/13   Riki Sheer, PA-C    Family History History reviewed. No pertinent family history.  Social History Social History  Substance Use Topics  . Smoking status: Never Smoker  . Smokeless tobacco: Never Used  . Alcohol use No     Allergies   Patient has no known allergies.   Review of Systems Review of Systems A complete 10 system review of systems was obtained and all systems are negative except as noted in the HPI and PMH.    Physical Exam Updated Vital Signs BP 134/84   Pulse 110   Temp 100.4 F (38 C) (Oral)   Resp 18   Ht 6' (1.829 m)   Wt (!) 324 lb 1 oz (147 kg)   SpO2 95%   BMI 43.95 kg/m   Physical Exam  Constitutional: She is oriented to person, place, and time. She appears well-developed and well-nourished. No distress.  HENT:  Head: Normocephalic and atraumatic.  Nose: Nose normal.  Mouth/Throat: Oropharynx is clear and moist. No oropharyngeal exudate.  Eyes: Conjunctivae and EOM are normal. Pupils are equal, round, and reactive to light. No scleral icterus.  Neck: Normal range of motion. Neck supple. No JVD present. No tracheal deviation present. No thyromegaly present.  Cardiovascular: Regular rhythm.  Tachycardia present.  Exam reveals no gallop and no friction rub.   No murmur heard. Pulmonary/Chest: Effort normal and breath sounds normal. No respiratory distress. She has no wheezes. She exhibits no tenderness.  Abdominal: Soft. Bowel sounds are normal. She exhibits no distension and no mass. There is no tenderness. There is no rebound and no guarding.    Musculoskeletal: Normal range of motion. She exhibits no edema or tenderness.  Lymphadenopathy:    She has no cervical adenopathy.  Neurological: She is alert and oriented to person, place, and time. No cranial nerve deficit. She exhibits normal muscle tone.  Skin: Skin is warm and dry. No rash noted. No erythema. No pallor.  Nursing note and vitals reviewed.    ED Treatments / Results  DIAGNOSTIC STUDIES: Oxygen Saturation is 95% on RA, adequate by my interpretation.    COORDINATION OF CARE: 12:40 AM Discussed treatment plan with pt at bedside and pt agreed to plan.  Labs (all labs ordered are listed, but only abnormal results are displayed) Labs Reviewed  CBC WITH DIFFERENTIAL/PLATELET - Abnormal; Notable for the following:       Result Value   RBC 3.66 (*)    Hemoglobin 11.6 (*)    HCT 34.4 (*)    All other  components within normal limits  COMPREHENSIVE METABOLIC PANEL - Abnormal; Notable for the following:    Calcium 8.4 (*)    Total Protein 6.0 (*)    Albumin 3.2 (*)    AST 14 (*)    ALT 12 (*)    All other components within normal limits  URINALYSIS, ROUTINE W REFLEX MICROSCOPIC - Abnormal; Notable for the following:    APPearance HAZY (*)    Specific Gravity, Urine 1.031 (*)    Bilirubin Urine SMALL (*)    Protein, ur 30 (*)    Leukocytes, UA TRACE (*)    Bacteria, UA RARE (*)    Squamous Epithelial / LPF 6-30 (*)    All other components within normal limits  LIPASE, BLOOD  POC URINE PREG, ED    EKG  EKG Interpretation None       Radiology Dg Chest 2 View  Result Date: 06/25/2016 CLINICAL DATA:  Cough and fever. EXAM: CHEST  2 VIEW COMPARISON:  12/01/2013 FINDINGS: The cardiomediastinal contours are normal. Central bronchial thickening. Pulmonary vasculature is normal. No consolidation, pleural effusion, or pneumothorax. No acute osseous abnormalities are seen. IMPRESSION: Central bronchial thickening, consistent with bronchitis. No pneumonia.  Electronically Signed   By: Rubye OaksMelanie  Ehinger M.D.   On: 06/25/2016 01:15    Procedures Procedures (including critical care time)  Medications Ordered in ED Medications  ondansetron (ZOFRAN-ODT) disintegrating tablet 4 mg (not administered)  ondansetron (ZOFRAN-ODT) 4 MG disintegrating tablet (4 mg  Given 06/24/16 1948)  ketorolac (TORADOL) 30 MG/ML injection 30 mg (30 mg Intravenous Given 06/25/16 0131)  sodium chloride 0.9 % bolus 1,000 mL (0 mLs Intravenous Stopped 06/25/16 0234)  metoCLOPramide (REGLAN) injection 10 mg (10 mg Intravenous Given 06/25/16 0128)  diphenhydrAMINE (BENADRYL) injection 50 mg (50 mg Intravenous Given 06/25/16 0127)  acetaminophen (TYLENOL) tablet 1,000 mg (1,000 mg Oral Given 06/25/16 0132)  fluconazole (DIFLUCAN) tablet 200 mg (200 mg Oral Given 06/25/16 0242)     Initial Impression / Assessment and Plan / ED Course  I have reviewed the triage vital signs and the nursing notes.  Pertinent labs & imaging results that were available during my care of the patient were reviewed by me and considered in my medical decision making (see chart for details).     Patient presents to the ED for flu like symptoms, fever, and headache. She is tachycardic and febrile.  Will obtain labs, check cxr, give tylenol/reglan/toradol/ and benadryl for treatment.  Also IVF given for tachycardia. She is requesting more percocet but will try headache cocktail first and I will reevaluate.   3:09 AM Upon repeat evaluation, patient staets she feels much better.  No pna on cxr.  Continue reglan at home as needed for headaches.  PCP fu advised within 3 days.  She appears well and in NAD.  VS remain within her norma l llimits and she is safe for Dc.   I personally performed the services described in this documentation, which was scribed in my presence. The recorded information has been reviewed and is accurate.      Final Clinical Impressions(s) / ED Diagnoses   Final diagnoses:  None     New Prescriptions New Prescriptions   No medications on file     Tomasita CrumbleAdeleke Genine Beckett, MD 06/25/16 [id number]

## 2016-06-24 NOTE — ED Notes (Signed)
Pt states that she is having chest pain upon coughing.

## 2016-06-24 NOTE — ED Triage Notes (Signed)
Patient states migraine headache also, states history of the same

## 2016-06-24 NOTE — ED Triage Notes (Signed)
Patient reports flu like symptoms x 2 days with new onset of vomiting last night, denies any specific abdominal pain. Denies urinary symptoms.

## 2016-06-25 ENCOUNTER — Emergency Department (HOSPITAL_COMMUNITY): Payer: BLUE CROSS/BLUE SHIELD

## 2016-06-25 LAB — URINALYSIS, ROUTINE W REFLEX MICROSCOPIC
Glucose, UA: NEGATIVE mg/dL
Hgb urine dipstick: NEGATIVE
Ketones, ur: NEGATIVE mg/dL
Nitrite: NEGATIVE
Protein, ur: 30 mg/dL — AB
Specific Gravity, Urine: 1.031 — ABNORMAL HIGH (ref 1.005–1.030)
pH: 7 (ref 5.0–8.0)

## 2016-06-25 LAB — COMPREHENSIVE METABOLIC PANEL
ALT: 12 U/L — ABNORMAL LOW (ref 14–54)
AST: 14 U/L — ABNORMAL LOW (ref 15–41)
Albumin: 3.2 g/dL — ABNORMAL LOW (ref 3.5–5.0)
Alkaline Phosphatase: 52 U/L (ref 38–126)
Anion gap: 7 (ref 5–15)
BUN: 12 mg/dL (ref 6–20)
CO2: 23 mmol/L (ref 22–32)
Calcium: 8.4 mg/dL — ABNORMAL LOW (ref 8.9–10.3)
Chloride: 107 mmol/L (ref 101–111)
Creatinine, Ser: 1 mg/dL (ref 0.44–1.00)
GFR calc Af Amer: >60 mL/min (ref >60)
GFR calc non Af Amer: >60 mL/min (ref >60)
Glucose, Bld: 99 mg/dL (ref 65–99)
Potassium: 3.7 mmol/L (ref 3.5–5.1)
Sodium: 137 mmol/L (ref 135–145)
Total Bilirubin: 0.6 mg/dL (ref 0.3–1.2)
Total Protein: 6 g/dL — ABNORMAL LOW (ref 6.5–8.1)

## 2016-06-25 LAB — CBC WITH DIFFERENTIAL/PLATELET
Basophils Absolute: 0 10*3/uL (ref 0.0–0.1)
Basophils Relative: 0 %
Eosinophils Absolute: 0 10*3/uL (ref 0.0–0.7)
Eosinophils Relative: 0 %
HCT: 34.4 % — ABNORMAL LOW (ref 36.0–46.0)
Hemoglobin: 11.6 g/dL — ABNORMAL LOW (ref 12.0–15.0)
Lymphocytes Relative: 15 %
Lymphs Abs: 1.5 10*3/uL (ref 0.7–4.0)
MCH: 31.7 pg (ref 26.0–34.0)
MCHC: 33.7 g/dL (ref 30.0–36.0)
MCV: 94 fL (ref 78.0–100.0)
Monocytes Absolute: 0.5 10*3/uL (ref 0.1–1.0)
Monocytes Relative: 5 %
Neutro Abs: 7.7 10*3/uL (ref 1.7–7.7)
Neutrophils Relative %: 80 %
Platelets: 237 10*3/uL (ref 150–400)
RBC: 3.66 MIL/uL — ABNORMAL LOW (ref 3.87–5.11)
RDW: 13.1 % (ref 11.5–15.5)
WBC: 9.6 10*3/uL (ref 4.0–10.5)

## 2016-06-25 LAB — LIPASE, BLOOD: Lipase: 19 U/L (ref 11–51)

## 2016-06-25 MED ORDER — FLUCONAZOLE 100 MG PO TABS
200.0000 mg | ORAL_TABLET | Freq: Once | ORAL | Status: AC
  Administered 2016-06-25: 200 mg via ORAL
  Filled 2016-06-25: qty 2

## 2016-06-25 MED ORDER — METOCLOPRAMIDE HCL 10 MG PO TABS: 10.0000 mg | ORAL_TABLET | Freq: Three times a day (TID) | ORAL | 0 refills | Status: DC | PRN

## 2016-08-01 ENCOUNTER — Other Ambulatory Visit: Payer: Self-pay | Admitting: Surgical Oncology

## 2017-08-14 ENCOUNTER — Ambulatory Visit (HOSPITAL_COMMUNITY)
Admission: EM | Admit: 2017-08-14 | Discharge: 2017-08-14 | Disposition: A | Discharge status: Home / Self Care | Payer: BLUE CROSS/BLUE SHIELD | Attending: Family Medicine | Admitting: Family Medicine

## 2017-08-14 ENCOUNTER — Encounter (HOSPITAL_COMMUNITY): Payer: Self-pay | Admitting: Family Medicine

## 2017-08-14 DIAGNOSIS — J111 Influenza due to unidentified influenza virus with other respiratory manifestations: Secondary | ICD-10-CM

## 2017-08-14 DIAGNOSIS — R69 Illness, unspecified: Secondary | ICD-10-CM | POA: Diagnosis not present

## 2017-08-14 MED ORDER — HYDROCODONE-HOMATROPINE 5-1.5 MG/5ML PO SYRP: 5.0000 mL | ORAL_SOLUTION | Freq: Four times a day (QID) | ORAL | 0 refills | Status: DC | PRN

## 2017-08-14 MED ORDER — OSELTAMIVIR PHOSPHATE 75 MG PO CAPS: 75.0000 mg | ORAL_CAPSULE | Freq: Two times a day (BID) | ORAL | 0 refills | Status: AC

## 2017-08-14 NOTE — ED Provider Notes (Signed)
  [facility]   [id number] 08/14/17 Arrival Time: 1112  ASSESSMENT & PLAN:  1. Influenza-like illness     Meds ordered this encounter  Medications  . HYDROcodone-homatropine (HYCODAN) 5-1.5 MG/5ML syrup    Sig: Take 5 mLs by mouth every 6 (six) hours as needed for cough.    Dispense:  90 mL    Refill:  0  . oseltamivir (TAMIFLU) 75 MG capsule    Sig: Take 1 capsule (75 mg total) by mouth 2 (two) times daily for 5 days.    Dispense:  10 capsule    Refill:  0   Cough medication sedation precautions. Discussed typical duration of symptoms. OTC symptom care as needed. Ensure adequate fluid intake and rest. May f/u with PCP or here as needed.  Reviewed expectations re: course of current medical issues. Questions answered. Outlined signs and symptoms indicating need for more acute intervention. Patient verbalized understanding. After Visit Summary given.   SUBJECTIVE: History from: patient.  Kathryn Middleton is a 42 y.o. female who presents with complaint of nasal congestion, post-nasal drainage, and a persistent dry cough. Onset abrupt, approximately 1 day ago. Overall fatigued with body aches. SOB: none. Wheezing: none. Fever: yes, subjective. Overall normal PO intake without n/v. Sick contacts: no. OTC treatment: Tylenol and Robitussin with mild help.   Social History   Tobacco Use  Smoking Status Never Smoker  Smokeless Tobacco Never Used    ROS: As per HPI.   OBJECTIVE:  Vitals:   08/14/17 1213  BP: (!) 153/92  Pulse: (!) 102  Resp: 18  Temp: 98 F (36.7 C)  SpO2: 97%     General appearance: alert; appears fatigued HEENT: nasal congestion; clear runny nose; throat irritation secondary to post-nasal drainage Neck: supple without LAD Lungs: unlabored respirations, symmetrical air entry; cough: moderate; no respiratory distress Skin: warm and dry Psychological: alert and cooperative; normal mood and affect   No Known Allergies  Past  Medical History:  Diagnosis Date  . Hypertension   . Migraines    History reviewed. No pertinent family history. Social History   Socioeconomic History  . Marital status: Married    Spouse name: Not on file  . Number of children: Not on file  . Years of education: Not on file  . Highest education level: Not on file  Social Needs  . Financial resource strain: Not on file  . Food insecurity - worry: Not on file  . Food insecurity - inability: Not on file  . Transportation needs - medical: Not on file  . Transportation needs - non-medical: Not on file  Occupational History  . Not on file  Tobacco Use  . Smoking status: Never Smoker  . Smokeless tobacco: Never Used  Substance and Sexual Activity  . Alcohol use: No  . Drug use: No  . Sexual activity: Yes    Birth control/protection: None  Other Topics Concern  . Not on file  Social History Narrative  . Not on file           Mardella LaymanHagler, Labron Bloodgood, MD 08/15/17 [phone]

## 2017-08-14 NOTE — Discharge Instructions (Signed)

## 2017-08-14 NOTE — ED Triage Notes (Addendum)
Pt here for body aches fever, cough, congestion since 2 days ago. Taking tylenol, day quil, robitussin.

## 2019-03-06 ENCOUNTER — Encounter (HOSPITAL_COMMUNITY): Payer: Self-pay

## 2019-03-06 ENCOUNTER — Other Ambulatory Visit: Payer: Self-pay

## 2019-03-06 ENCOUNTER — Ambulatory Visit (HOSPITAL_COMMUNITY)
Admission: EM | Admit: 2019-03-06 | Discharge: 2019-03-06 | Disposition: A | Discharge status: Home / Self Care | Payer: 59 | Attending: Family Medicine | Admitting: Family Medicine

## 2019-03-06 DIAGNOSIS — I10 Essential (primary) hypertension: Secondary | ICD-10-CM | POA: Diagnosis not present

## 2019-03-06 DIAGNOSIS — R42 Dizziness and giddiness: Secondary | ICD-10-CM

## 2019-03-06 DIAGNOSIS — G43809 Other migraine, not intractable, without status migrainosus: Secondary | ICD-10-CM | POA: Diagnosis not present

## 2019-03-06 DIAGNOSIS — Z20828 Contact with and (suspected) exposure to other viral communicable diseases: Secondary | ICD-10-CM | POA: Insufficient documentation

## 2019-03-06 LAB — CBC
HCT: 36.4 % (ref 36.0–46.0)
Hemoglobin: 12 g/dL (ref 12.0–15.0)
MCH: 30.3 pg (ref 26.0–34.0)
MCHC: 33 g/dL (ref 30.0–36.0)
MCV: 91.9 fL (ref 80.0–100.0)
Platelets: 318 10*3/uL (ref 150–400)
RBC: 3.96 MIL/uL (ref 3.87–5.11)
RDW: 12.4 % (ref 11.5–15.5)
WBC: 12.3 10*3/uL — ABNORMAL HIGH (ref 4.0–10.5)
nRBC: 0 % (ref 0.0–0.2)

## 2019-03-06 LAB — BASIC METABOLIC PANEL
Anion gap: 12 (ref 5–15)
BUN: 9 mg/dL (ref 6–20)
CO2: 24 mmol/L (ref 22–32)
Calcium: 9.4 mg/dL (ref 8.9–10.3)
Chloride: 104 mmol/L (ref 98–111)
Creatinine, Ser: 0.88 mg/dL (ref 0.44–1.00)
GFR calc Af Amer: >60 mL/min (ref >60)
GFR calc non Af Amer: >60 mL/min (ref >60)
Glucose, Bld: 97 mg/dL (ref 70–99)
Potassium: 4 mmol/L (ref 3.5–5.1)
Sodium: 140 mmol/L (ref 135–145)

## 2019-03-06 MED ORDER — CLONIDINE HCL 0.1 MG PO TABS
0.1000 mg | ORAL_TABLET | Freq: Once | ORAL | Status: AC
  Administered 2019-03-06: 0.1 mg via ORAL

## 2019-03-06 MED ORDER — CLONIDINE HCL 0.1 MG PO TABS
ORAL_TABLET | ORAL | Status: AC
  Filled 2019-03-06: qty 1

## 2019-03-06 NOTE — ED Provider Notes (Signed)
[facility]   [id number] 03/06/19 Arrival Time: 3474  ASSESSMENT & PLAN:  1. Lightheadedness   2. Uncontrolled hypertension     Normal neurologic exam. No suspicion for ICH or SAH. No indication for neurodiagnostic imaging at this time. Discussed.  Meds ordered this encounter  Medications  . cloNIDine (CATAPRES) tablet 0.1 mg  BP stable. Without current symptoms except for fatigue and mild headache. Declines ED evaluation.  Continue Lisinopril. Does not desire additional agent.  Follow-up Information    Schedule an appointment as soon as possible for a visit  with Vonna Drafts, FNP.   Specialty: Nurse Practitioner Contact information: [place] [address] [phone]        Schedule an appointment as soon as possible for a visit  with Primary Care at [facility].   Specialty: Family Medicine Why: If symptoms worsen in any way. Contact information: [address], [place] [phone]          Reviewed expectations re: course of current medical issues. Questions answered. Outlined signs and symptoms indicating need for more acute intervention. Patient verbalized understanding. After Visit Summary given.   SUBJECTIVE:  Kathryn Middleton is a 43 y.o. female who presents for evaluation of dizziness described as lightheadedness. Current symptoms first noted today after waking and have waxed and waned but are better overall. No vertigo described. Aggravating factors: none identified. Positions that worsen symptoms: none. Denies coordination problems, gait problems, speech problems and visual changes as well as otalgia, tinnitus and hearing loss. Recent infections: none. Head trauma: denied. Drug ingestion: none. Noise exposure: none. No associated CP, or palpatations reported. Does report feeling "a little weak and tired". No significant sleep disturbances. Also reports feeling SOB  earlier today; none currently. Normal PO intake. Previous workup/treatments: none. H/O similar: no. Also reports occasional migraine headaches. "Feel like one is coming on". Has not taken her migraine medication.  Increased blood pressure noted today. At work 198/100. Reports that she is treated for HTN.  She reports taking medications as instructed, no chest pain on exertion, no dyspnea on exertion, no swelling of ankles, no orthopnea or paroxysmal nocturnal dyspnea, no palpitations and no intermittent claudication symptoms.  No alcohol or illicit drug use reported.  ROS: As per HPI. All other systems negative.    OBJECTIVE:  Vitals:   03/06/19 1343  BP: (!) 172/105  Pulse: (!) 122  Resp: 19  Temp: 98 F (36.7 C)  TempSrc: Oral  SpO2: 100%    General appearance: alert; no distress Eyes: PERRLA; EOMI; conjunctiva normal HENT: normocephalic; atraumatic; oral mucosa normal Neck: supple with FROM Lungs: clear to auscultation bilaterally; unlabored; speaks full sentences without difficulty Heart: tachycardia; regular (recheck pulse 104 and regular) Abdomen: soft, non-tender; bowel sounds normal Extremities: no cedema; symmetrical with no gross deformities; no calf swelling Skin: warm and dry Neurologic: normal gait; DTR's normal and symmetric; CN 2-12 grossly intact Psychological: alert and cooperative; normal mood and affect  Investigations: Results for orders placed or performed during the hospital encounter of 03/06/19  Novel Coronavirus, NAA (Hosp order, Send-out to Ref Lab; TAT 18-24 hrs   Specimen: Nasopharyngeal Swab; Respiratory  Result Value Ref Range   SARS-CoV-2, NAA NOT DETECTED NOT DETECTED   Coronavirus Source NASOPHARYNGEAL   CBC  Result Value Ref Range   WBC 12.3 (H) 4.0 - 10.5 K/uL   RBC 3.96 3.87 - 5.11 MIL/uL   Hemoglobin 12.0 12.0 - 15.0 g/dL  HCT 36.4 36.0 - 46.0 %   MCV 91.9 80.0 - 100.0 fL   MCH 30.3 26.0 - 34.0 pg   MCHC 33.0 30.0 - 36.0 g/dL    RDW 06.2 69.4 - 85.4 %   Platelets 318 150 - 400 K/uL   nRBC 0.0 0.0 - 0.2 %  Basic metabolic panel  Result Value Ref Range   Sodium 140 135 - 145 mmol/L   Potassium 4.0 3.5 - 5.1 mmol/L   Chloride 104 98 - 111 mmol/L   CO2 24 22 - 32 mmol/L   Glucose, Bld 97 70 - 99 mg/dL   BUN 9 6 - 20 mg/dL   Creatinine, Ser 6.27 0.44 - 1.00 mg/dL   Calcium 9.4 8.9 - 03.5 mg/dL   GFR calc non Af Amer >60 >60 mL/min   GFR calc Af Amer >60 >60 mL/min   Anion gap 12 5 - 15   Labs Reviewed  CBC - Abnormal; Notable for the following components:      Result Value   WBC 12.3 (*)    All other components within normal limits  NOVEL CORONAVIRUS, NAA (HOSP ORDER, SEND-OUT TO REF LAB; TAT 18-24 HRS)  BASIC METABOLIC PANEL    No Known Allergies  Past Medical History:  Diagnosis Date  . Hypertension   . Migraines    Social History   Socioeconomic History  . Marital status: Married    Spouse name: Not on file  . Number of children: Not on file  . Years of education: Not on file  . Highest education level: Not on file  Occupational History  . Not on file  Social Needs  . Financial resource strain: Not on file  . Food insecurity    Worry: Not on file    Inability: Not on file  . Transportation needs    Medical: Not on file    Non-medical: Not on file  Tobacco Use  . Smoking status: Never Smoker  . Smokeless tobacco: Never Used  Substance and Sexual Activity  . Alcohol use: No  . Drug use: No  . Sexual activity: Yes    Birth control/protection: None  Lifestyle  . Physical activity    Days per week: Not on file    Minutes per session: Not on file  . Stress: Not on file  Relationships  . Social Musician on phone: Not on file    Gets together: Not on file    Attends religious service: Not on file    Active member of club or organization: Not on file    Attends meetings of clubs or organizations: Not on file    Relationship status: Not on file  . Intimate partner  violence    Fear of current or ex partner: Not on file    Emotionally abused: Not on file    Physically abused: Not on file    Forced sexual activity: Not on file  Other Topics Concern  . Not on file  Social History Narrative  . Not on file   Family History  Problem Relation Age of Onset  . Diabetes Mother   . Hypertension Mother   . Diabetes Father   . Hypertension Father    Past Surgical History:  Procedure Laterality Date  . CESAREAN SECTION    . CESAREAN SECTION        Mardella Layman, MD 03/11/19 [phone]

## 2019-03-06 NOTE — Discharge Instructions (Signed)
Your blood pressure was noted to be elevated during your visit today. You may return here within the next few days to recheck if unable to see your primary care doctor. If your blood pressure remains persistently elevated, you may need to discuss this with your primary provider.  BP (!) 172/117 (BP Location: Left Wrist)    Pulse (!) 102    Temp 98 F (36.7 C) (Oral)    Resp 19    SpO2 100%   Your evaluation today was not suggestive of any emergent condition requiring medical intervention at this time. Your ECG (heart tracing) did not show any worrisome changes. However, some medical problems make take more time to appear. Therefore, it's very important that you pay attention to any new symptoms or worsening of your current condition.  Please proceed directly to the Emergency Department immediately should you feel worse in any way.  If your Covid-19 test is positive, you will get a phone call from [facility] regarding your results. If your Covid-19 test is negative, you will NOT get a phone call from [facility] with your results. You may view your results on MyChart. If you do not have a MyChart account, sign up instructions are in your discharge papers.

## 2019-03-06 NOTE — ED Triage Notes (Signed)
Patient presents to Urgent Care with complaints of dizziness, shortness of breath, and hypertension since earlier today while she was walking into work. Patient reports her bp was 198/100 and she does not miss doses of her lisinopril.

## 2019-03-08 LAB — NOVEL CORONAVIRUS, NAA (HOSP ORDER, SEND-OUT TO REF LAB; TAT 18-24 HRS): SARS-CoV-2, NAA: NOT DETECTED

## 2019-03-10 ENCOUNTER — Encounter (HOSPITAL_COMMUNITY): Payer: Self-pay

## 2019-10-25 ENCOUNTER — Ambulatory Visit: Payer: 59 | Attending: Internal Medicine

## 2020-06-07 ENCOUNTER — Encounter (HOSPITAL_COMMUNITY): Payer: Self-pay

## 2020-06-07 ENCOUNTER — Ambulatory Visit (HOSPITAL_COMMUNITY)
Admission: EM | Admit: 2020-06-07 | Discharge: 2020-06-07 | Disposition: A | Discharge status: Home / Self Care | Payer: 59 | Attending: Family Medicine | Admitting: Family Medicine

## 2020-06-07 DIAGNOSIS — U071 COVID-19: Secondary | ICD-10-CM | POA: Insufficient documentation

## 2020-06-07 DIAGNOSIS — J069 Acute upper respiratory infection, unspecified: Secondary | ICD-10-CM | POA: Diagnosis not present

## 2020-06-07 LAB — RESP PANEL BY RT-PCR (FLU A&B, COVID) ARPGX2
Influenza A by PCR: NEGATIVE
Influenza B by PCR: NEGATIVE
SARS Coronavirus 2 by RT PCR: POSITIVE — AB

## 2020-06-07 MED ORDER — OSELTAMIVIR PHOSPHATE 75 MG PO CAPS: 75.0000 mg | ORAL_CAPSULE | Freq: Two times a day (BID) | ORAL | 0 refills | Status: AC

## 2020-06-07 MED ORDER — OSELTAMIVIR PHOSPHATE 75 MG PO CAPS: 75.0000 mg | ORAL_CAPSULE | Freq: Two times a day (BID) | ORAL | 0 refills | Status: DC

## 2020-06-07 MED ORDER — HYDROCODONE-HOMATROPINE 5-1.5 MG/5ML PO SYRP: 5.0000 mL | ORAL_SOLUTION | Freq: Four times a day (QID) | ORAL | 0 refills | Status: DC | PRN

## 2020-06-07 NOTE — ED Provider Notes (Signed)
  [facility]   [id number] 06/07/20 Arrival Time: 0849  ASSESSMENT & PLAN:  1. Viral URI with cough      COVID-19 and influenza testing sent. See letter/work note on file for self-isolation guidelines. OTC symptom care as needed.   Meds ordered this encounter  Medications  . HYDROcodone-homatropine (HYCODAN) 5-1.5 MG/5ML syrup    Sig: Take 5 mLs by mouth every 6 (six) hours as needed for cough.    Dispense:  90 mL    Refill:  0  . oseltamivir (TAMIFLU) 75 MG capsule    Sig: Take 1 capsule (75 mg total) by mouth 2 (two) times daily for 5 days.    Dispense:  10 capsule    Refill:  0   Instructed to only start Tamiflu if flu test is positive.   Follow-up Information    Diamantina Providence, FNP.   Specialty: Nurse Practitioner Why: As needed. Contact information: [address] [place] [address] [phone]               Reviewed expectations re: course of current medical issues. Questions answered. Outlined signs and symptoms indicating need for more acute intervention. Understanding verbalized. After Visit Summary given.   SUBJECTIVE: History from: patient. Kathryn Middleton is a 45 y.o. female who reports cough, body aches, congestion; abrupt onset; 3 days. Denies: fever and difficulty breathing. Normal PO intake without n/v/d.    OBJECTIVE:  Vitals:   06/07/20 1006  BP: (!) 132/92  Pulse: (!) 106  Resp: 20  Temp: 98.5 F (36.9 C)  TempSrc: Oral  SpO2: 94%    Slight tachycardia noted.  General appearance: alert; no distress Eyes: PERRLA; EOMI; conjunctiva normal HENT: [place]; AT; with nasal congestion Neck: supple  Lungs: speaks full sentences without difficulty; unlabored Extremities: no edema Skin: warm and dry Neurologic: normal gait Psychological: alert and cooperative; normal mood and affect  Labs:  Labs Reviewed  RESP PANEL BY RT-PCR (FLU A&B, COVID) [id number]    No Known Allergies  Past Medical History:   Diagnosis Date  . Hypertension   . Migraines    Social History   Socioeconomic History  . Marital status: Married    Spouse name: Not on file  . Number of children: Not on file  . Years of education: Not on file  . Highest education level: Not on file  Occupational History  . Not on file  Tobacco Use  . Smoking status: Never Smoker  . Smokeless tobacco: Never Used  Vaping Use  . Vaping Use: Never used  Substance and Sexual Activity  . Alcohol use: No  . Drug use: No  . Sexual activity: Yes    Birth control/protection: None  Other Topics Concern  . Not on file  Social History Narrative  . Not on file   Social Determinants of Health   Financial Resource Strain: Not on file  Food Insecurity: Not on file  Transportation Needs: Not on file  Physical Activity: Not on file  Stress: Not on file  Social Connections: Not on file  Intimate Partner Violence: Not on file   Family History  Problem Relation Age of Onset  . Diabetes Mother   . Hypertension Mother   . Diabetes Father   . Hypertension Father    Past Surgical History:  Procedure Laterality Date  . CESAREAN SECTION    . CESAREAN SECTION       Mardella Layman, MD 06/07/20 1046

## 2020-06-07 NOTE — ED Triage Notes (Signed)
Pt in with c/o cough, body aches, and congestion and sob that has been going on for 3 days now   Pt has tried mucinex with some relief

## 2020-06-07 NOTE — Discharge Instructions (Addendum)
You have been tested for COVID-19 today. If your test returns positive, you will receive a phone call from [facility] regarding your results. Negative test results are not called. Both positive and negative results area always visible on MyChart. If you do not have a MyChart account, sign up instructions are provided in your discharge papers. Please do not hesitate to contact us should you have questions or concerns.  Be aware, your cough medication may cause drowsiness. Please do not drive, operate heavy machinery or make important decisions while on this medication, it can cloud your judgement.  

## 2020-06-07 NOTE — ED Notes (Signed)
Pt returned to UC stating [facility] was out of cough medication and requested rx be sent to [facility]. Called [facility] pharmacy to cancel RX.

## 2020-06-08 ENCOUNTER — Telehealth (HOSPITAL_COMMUNITY): Payer: Self-pay | Admitting: Internal Medicine

## 2020-06-08 MED ORDER — HYDROCODONE-HOMATROPINE 5-1.5 MG/5ML PO SYRP: 5.0000 mL | ORAL_SOLUTION | Freq: Four times a day (QID) | ORAL | 0 refills | Status: DC | PRN

## 2020-06-08 NOTE — Telephone Encounter (Signed)
Hycodan resent to pharmacy.

## 2020-06-12 ENCOUNTER — Emergency Department (HOSPITAL_COMMUNITY): Payer: 59

## 2020-06-12 ENCOUNTER — Other Ambulatory Visit: Payer: Self-pay

## 2020-06-12 ENCOUNTER — Emergency Department (HOSPITAL_COMMUNITY)
Admission: EM | Admit: 2020-06-12 | Discharge: 2020-06-12 | Disposition: A | Discharge status: Home / Self Care | Payer: 59 | Attending: Emergency Medicine | Admitting: Emergency Medicine

## 2020-06-12 DIAGNOSIS — Z79899 Other long term (current) drug therapy: Secondary | ICD-10-CM | POA: Diagnosis not present

## 2020-06-12 DIAGNOSIS — U071 COVID-19: Secondary | ICD-10-CM

## 2020-06-12 DIAGNOSIS — I10 Essential (primary) hypertension: Secondary | ICD-10-CM | POA: Diagnosis not present

## 2020-06-12 DIAGNOSIS — R059 Cough, unspecified: Secondary | ICD-10-CM | POA: Diagnosis present

## 2020-06-12 LAB — BASIC METABOLIC PANEL
Anion gap: 13 (ref 5–15)
BUN: 10 mg/dL (ref 6–20)
CO2: 23 mmol/L (ref 22–32)
Calcium: 8.4 mg/dL — ABNORMAL LOW (ref 8.9–10.3)
Chloride: 103 mmol/L (ref 98–111)
Creatinine, Ser: 0.88 mg/dL (ref 0.44–1.00)
GFR, Estimated: >60 mL/min (ref >60)
Glucose, Bld: 121 mg/dL — ABNORMAL HIGH (ref 70–99)
Potassium: 3.8 mmol/L (ref 3.5–5.1)
Sodium: 139 mmol/L (ref 135–145)

## 2020-06-12 LAB — CBC
HCT: 38.8 % (ref 36.0–46.0)
Hemoglobin: 12.3 g/dL (ref 12.0–15.0)
MCH: 29.4 pg (ref 26.0–34.0)
MCHC: 31.7 g/dL (ref 30.0–36.0)
MCV: 92.8 fL (ref 80.0–100.0)
Platelets: 265 10*3/uL (ref 150–400)
RBC: 4.18 MIL/uL (ref 3.87–5.11)
RDW: 12.8 % (ref 11.5–15.5)
WBC: 6.8 10*3/uL (ref 4.0–10.5)
nRBC: 0 % (ref 0.0–0.2)

## 2020-06-12 MED ORDER — ALBUTEROL SULFATE HFA 108 (90 BASE) MCG/ACT IN AERS: 2.0000 | INHALATION_SPRAY | Freq: Four times a day (QID) | RESPIRATORY_TRACT | 1 refills | Status: DC | PRN

## 2020-06-12 NOTE — ED Triage Notes (Signed)
Pt diagnosed w covid Monday, complains of increasing SHOB & productive cough since dx. Pt was vaccinated approx 9mos ago.

## 2020-06-12 NOTE — ED Provider Notes (Signed)
[facility] EMERGENCY DEPARTMENT Provider Note   CSN: [id number] Arrival date & time: 06/12/20  0549     History Chief Complaint  Patient presents with  . Shortness of Breath  . Covid Positive    Kathryn Middleton is a 45 y.o. female presenting with covid symptoms.  She reports onset of symptoms around 06/05/20, tested positive on 06/08/19, presenting now with cough, congestion, myalgias.  She is eating and drinking okay.  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  Hx of obesity.  No other sig medical problems.  Son had similar viral symptoms around [place] of last year.  HPI     Past Medical History:  Diagnosis Date  . Hypertension   . Migraines     There are no problems to display for this patient.   Past Surgical History:  Procedure Laterality Date  . CESAREAN SECTION    . CESAREAN SECTION       OB History   No obstetric history on file.     Family History  Problem Relation Age of Onset  . Diabetes Mother   . Hypertension Mother   . Diabetes Father   . Hypertension Father     Social History   Tobacco Use  . Smoking status: Never Smoker  . Smokeless tobacco: Never Used  Vaping Use  . Vaping Use: Never used  Substance Use Topics  . Alcohol use: No  . Drug use: No    Home Medications Prior to Admission medications   Medication Sig Start Date End Date Taking? Authorizing Provider  albuterol (VENTOLIN HFA) 108 (90 Base) MCG/ACT inhaler Inhale 2 puffs into the lungs every 6 (six) hours as needed for wheezing or shortness of breath. 06/12/20  Yes Eaden Hettinger, Kermit Balo, MD  acetaminophen (TYLENOL) 500 MG tablet Take 1,000 mg by mouth every 6 (six) hours as needed for headache.    [provider]  amLODipine (NORVASC) 10 MG tablet Take 10 mg by mouth daily. 05/11/20   [provider]  aspirin-acetaminophen-caffeine (EXCEDRIN MIGRAINE) [phone] MG per tablet Take 2 tablets by mouth every 6 (six) hours as needed. For migraine    [provider]  buPROPion (WELLBUTRIN) 75 MG tablet Take 75 mg by mouth 2 (two) times daily.    [provider]  Cholecalciferol 125 MCG (5000 UT) TABS Take by mouth. 06/25/15   [provider]  clonazePAM (KLONOPIN) 0.5 MG tablet Take 0.5 mg by mouth 2 (two) times daily as needed. 05/18/20   [provider]  cyclobenzaprine (FLEXERIL) 10 MG tablet Take 1 tablet (10 mg total) by mouth 2 (two) times daily as needed for muscle spasms. 07/31/14   Marlon Pel, PA-C  ferrous sulfate 325 (65 FE) MG tablet Take 325 mg by mouth daily with breakfast.    [provider]  HYDROcodone-homatropine (HYCODAN) 5-1.5 MG/5ML syrup Take 5 mLs by mouth every 6 (six) hours as needed for cough. 06/08/20   Lamptey, Britta Mccreedy, MD  liraglutide (VICTOZA) 18 MG/3ML SOPN Inject into the skin.    [provider]  lisinopril-hydrochlorothiazide (PRINZIDE,ZESTORETIC) 20-25 MG per tablet Take 1 tablet by mouth daily.    [provider]  medroxyPROGESTERone (DEPO-SUBQ PROVERA) 104 MG/0.65ML injection Inject 104 mg into the skin every 3 (three) months.    [provider]  metoCLOPramide (REGLAN) 10 MG tablet Take 1 tablet (10 mg total) by mouth every 8 (eight) hours as needed (headache). 06/25/16   Tomasita Crumble, MD  Multiple Vitamin (MULTI-VITAMIN) tablet  Take 1 tablet by mouth daily.    [provider]  oseltamivir (TAMIFLU) 75 MG capsule Take 1 capsule (75 mg total) by mouth 2 (two) times daily for 5 days. 06/07/20 06/12/20  Mardella Layman, MD  QUEtiapine (SEROQUEL) 300 MG tablet Take 300 mg by mouth 2 (two) times daily. 05/21/20   [provider]  SUMAtriptan (IMITREX) 100 MG tablet Take 1 tablet (100 mg total) by mouth every 2 (two) hours as needed for migraine or headache. May repeat in 2 hours if headache persists or recurs. 11/01/13   Riki Sheer, PA-C    Allergies    Patient has no known allergies.  Review of Systems   Review of Systems   Constitutional: Positive for fatigue. Negative for chills and fever.  HENT: Positive for congestion. Negative for sore throat.   Eyes: Negative for pain and visual disturbance.  Respiratory: Positive for cough and shortness of breath.   Cardiovascular: Negative for chest pain and palpitations.  Gastrointestinal: Negative for abdominal pain and vomiting.  Musculoskeletal: Positive for arthralgias and myalgias. Negative for back pain.  Skin: Negative for color change and rash.  Neurological: Negative for seizures and syncope.  All other systems reviewed and are negative.   Physical Exam Updated Vital Signs BP (!) 142/87 (BP Location: Right Arm)   Pulse 98   Temp 97.8 F (36.6 C) (Oral)   Resp 18   SpO2 98%   Physical Exam Constitutional:      General: She is not in acute distress.    Appearance: She is obese.  HENT:     Head: Normocephalic and atraumatic.  Eyes:     Conjunctiva/sclera: Conjunctivae normal.     Pupils: Pupils are equal, round, and reactive to light.  Cardiovascular:     Rate and Rhythm: Normal rate and regular rhythm.  Pulmonary:     Effort: Pulmonary effort is normal. No respiratory distress.     Breath sounds: No decreased breath sounds.     Comments: 95% on room air Abdominal:     General: There is no distension.     Tenderness: There is no abdominal tenderness.  Skin:    General: Skin is warm and dry.  Neurological:     General: No focal deficit present.     Mental Status: She is alert. Mental status is at baseline.  Psychiatric:        Mood and Affect: Mood normal.        Behavior: Behavior normal.     ED Results / Procedures / Treatments   Labs (all labs ordered are listed, but only abnormal results are displayed) Labs Reviewed  BASIC METABOLIC PANEL - Abnormal; Notable for the following components:      Result Value   Glucose, Bld 121 (*)    Calcium 8.4 (*)    All other components within normal limits  CBC    EKG EKG  Interpretation  Date/Time:  Saturday June 12 2020 06:04:47 EST Ventricular Rate:  106 PR Interval:  162 QRS Duration: 74 QT Interval:  348 QTC Calculation: 462 R Axis:   20 Text Interpretation: Sinus tachycardia Abnormal QRS-T angle, consider primary T wave abnormality Abnormal ECG No STEMI Confirmed by Alvester Chou [phone]) on 06/12/2020 12:44:26 PM   Radiology DG Chest Portable 1 View  Result Date: 06/12/2020 CLINICAL DATA:  45 year old female with shortness of breath, COVID positive 1 week ago. EXAM: PORTABLE CHEST 1 VIEW COMPARISON:  06/25/2016 FINDINGS: Lordotic projection. The heart size and  mediastinal contours are within normal limits. Both lungs are clear. The visualized skeletal structures are unremarkable. IMPRESSION: No acute cardiopulmonary process. Electronically Signed   By: Marliss Coots MD   On: 06/12/2020 13:28    Procedures Procedures (including critical care time)  Medications Ordered in ED Medications - No data to display  ED Course  I have reviewed the triage vital signs and the nursing notes.  Pertinent labs & imaging results that were available during my care of the patient were reviewed by me and considered in my medical decision making (see chart for details).  45 yo female here on day 8 of covid symptoms No acute respiratory distress, no hypoxia on exam  Labs ordered and reviewed - no significant findings on BMP or CBC DG chest ordered and reviewed - no focal consolidations,  ECG personally reviewed with NSR, no acute ischemic findings  No evidence of bacterial infection at this time Symptomatic mgmt at home, albuterol prescribed PRN, work note provided  Reynalda Canny was evaluated in Emergency Department on 06/12/2020 for the symptoms described in the history of present illness. She was evaluated in the context of the global COVID-19 pandemic, which necessitated consideration that the patient might be at risk for infection with the SARS-CoV-2  virus that causes COVID-19. Institutional protocols and algorithms that pertain to the evaluation of patients at risk for COVID-19 are in a state of rapid change based on information released by regulatory bodies including the CDC and federal and state organizations. These policies and algorithms were followed during the patient's care in the ED.    Final Clinical Impression(s) / ED Diagnoses Final diagnoses:  COVID-19    Rx / DC Orders ED Discharge Orders         Ordered    albuterol (VENTOLIN HFA) 108 (90 Base) MCG/ACT inhaler  Every 6 hours PRN        06/12/20 1445           Elliett Guarisco, Kermit Balo, MD 06/12/20 1611

## 2021-03-15 ENCOUNTER — Other Ambulatory Visit: Payer: Self-pay | Admitting: Nurse Practitioner

## 2021-03-15 DIAGNOSIS — Z1231 Encounter for screening mammogram for malignant neoplasm of breast: Secondary | ICD-10-CM

## 2021-08-22 IMAGING — DX DG CHEST 1V PORT
1 series · 1 of 1 positions shown · non-contrast
Comparison: 06/25/2016

CLINICAL DATA: 44-year-old female with shortness of breath, COVID
positive 1 week ago.

EXAM:
PORTABLE CHEST 1 VIEW

[chest]
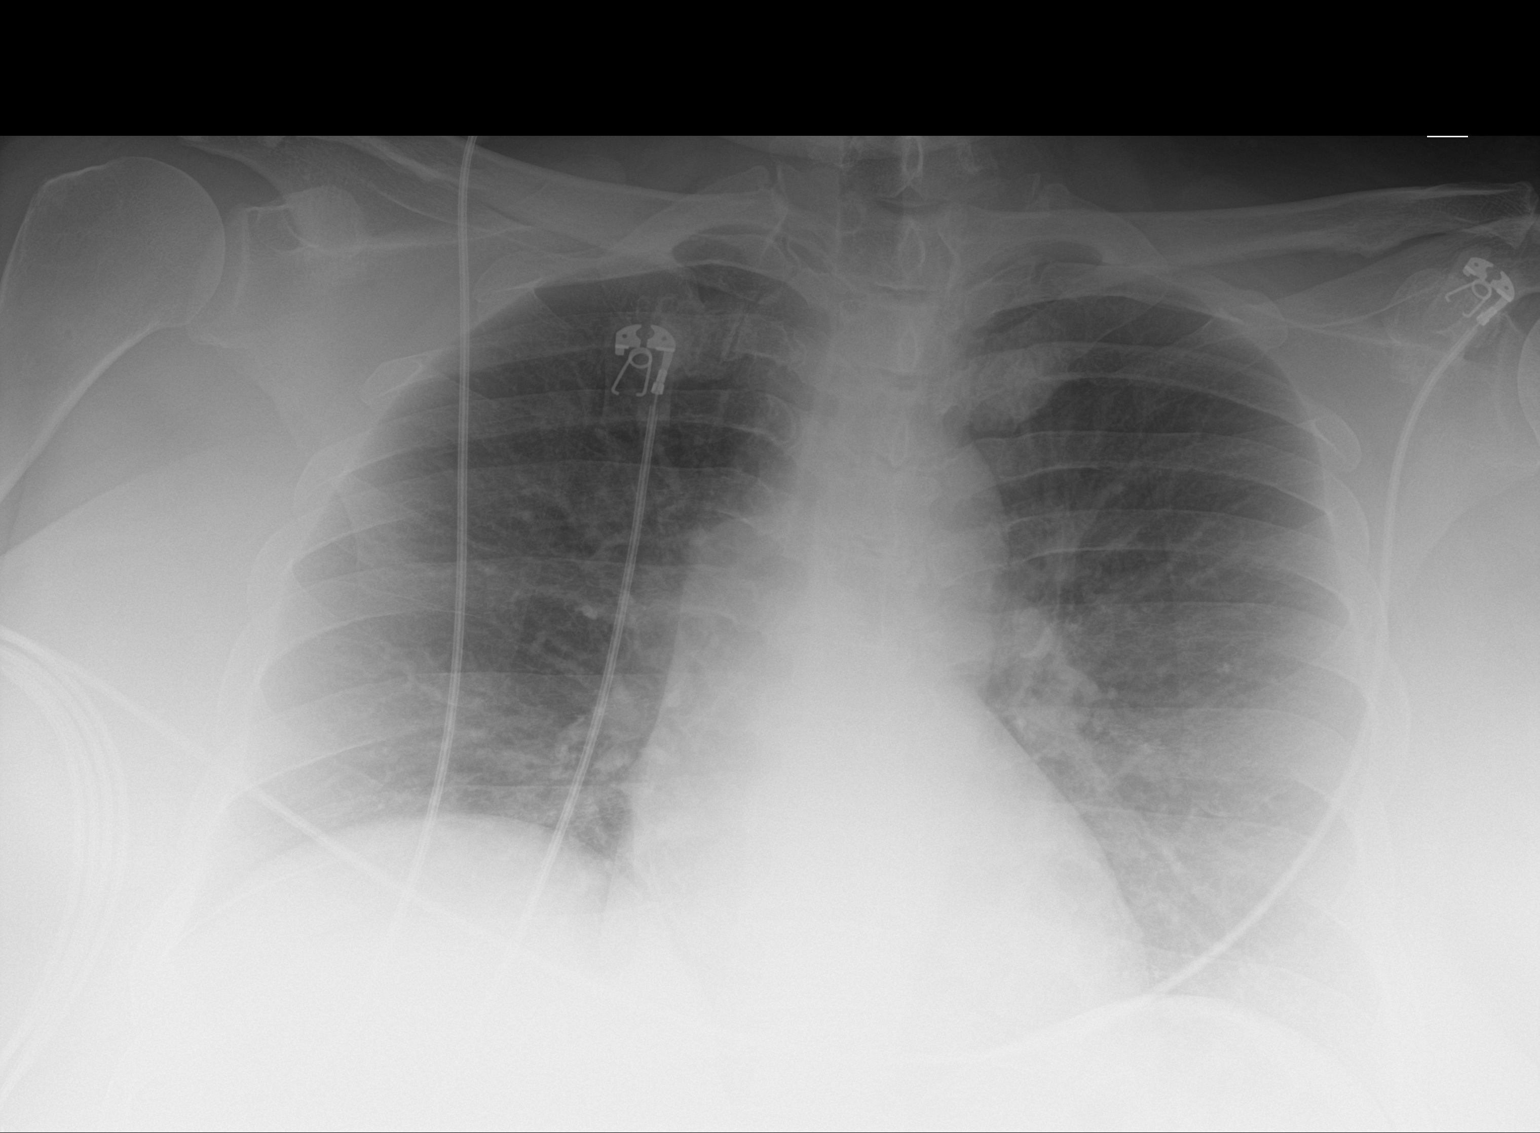

[1 of 1 positions shown; findings below may reference images not displayed]

FINDINGS: Lordotic projection. The heart size and mediastinal contours are
within normal limits. Both lungs are clear. The visualized skeletal
structures are unremarkable.
IMPRESSION: No acute cardiopulmonary process.

## 2022-01-19 ENCOUNTER — Emergency Department (HOSPITAL_COMMUNITY)
Admission: EM | Admit: 2022-01-19 | Discharge: 2022-01-20 | Disposition: A | Discharge status: Home / Self Care | Payer: 59 | Attending: Emergency Medicine | Admitting: Emergency Medicine

## 2022-01-19 ENCOUNTER — Emergency Department (HOSPITAL_COMMUNITY): Payer: 59

## 2022-01-19 DIAGNOSIS — I10 Essential (primary) hypertension: Secondary | ICD-10-CM | POA: Diagnosis not present

## 2022-01-19 DIAGNOSIS — Z79899 Other long term (current) drug therapy: Secondary | ICD-10-CM | POA: Insufficient documentation

## 2022-01-19 DIAGNOSIS — S0990XA Unspecified injury of head, initial encounter: Secondary | ICD-10-CM | POA: Diagnosis present

## 2022-01-19 DIAGNOSIS — S060X0A Concussion without loss of consciousness, initial encounter: Secondary | ICD-10-CM | POA: Insufficient documentation

## 2022-01-19 DIAGNOSIS — Z7982 Long term (current) use of aspirin: Secondary | ICD-10-CM | POA: Diagnosis not present

## 2022-01-19 DIAGNOSIS — W230XXA Caught, crushed, jammed, or pinched between moving objects, initial encounter: Secondary | ICD-10-CM | POA: Diagnosis not present

## 2022-01-19 LAB — I-STAT CHEM 8, ED
BUN: 10 mg/dL (ref 6–20)
Calcium, Ion: 1.22 mmol/L (ref 1.15–1.40)
Chloride: 102 mmol/L (ref 98–111)
Creatinine, Ser: 0.8 mg/dL (ref 0.44–1.00)
Glucose, Bld: 105 mg/dL — ABNORMAL HIGH (ref 70–99)
HCT: 38 % (ref 36.0–46.0)
Hemoglobin: 12.9 g/dL (ref 12.0–15.0)
Potassium: 4.1 mmol/L (ref 3.5–5.1)
Sodium: 138 mmol/L (ref 135–145)
TCO2: 25 mmol/L (ref 22–32)

## 2022-01-19 LAB — CBC WITH DIFFERENTIAL/PLATELET
Abs Immature Granulocytes: 0.05 10*3/uL (ref 0.00–0.07)
Basophils Absolute: 0.1 10*3/uL (ref 0.0–0.1)
Basophils Relative: 1 %
Eosinophils Absolute: 0.1 10*3/uL (ref 0.0–0.5)
Eosinophils Relative: 1 %
HCT: 36.9 % (ref 36.0–46.0)
Hemoglobin: 12.2 g/dL (ref 12.0–15.0)
Immature Granulocytes: 0 %
Lymphocytes Relative: 22 %
Lymphs Abs: 2.7 10*3/uL (ref 0.7–4.0)
MCH: 31.2 pg (ref 26.0–34.0)
MCHC: 33.1 g/dL (ref 30.0–36.0)
MCV: 94.4 fL (ref 80.0–100.0)
Monocytes Absolute: 0.5 10*3/uL (ref 0.1–1.0)
Monocytes Relative: 4 %
Neutro Abs: 9.1 10*3/uL — ABNORMAL HIGH (ref 1.7–7.7)
Neutrophils Relative %: 72 %
Platelets: 337 10*3/uL (ref 150–400)
RBC: 3.91 MIL/uL (ref 3.87–5.11)
RDW: 12.5 % (ref 11.5–15.5)
WBC: 12.6 10*3/uL — ABNORMAL HIGH (ref 4.0–10.5)
nRBC: 0 % (ref 0.0–0.2)

## 2022-01-19 LAB — BASIC METABOLIC PANEL
Anion gap: 10 (ref 5–15)
BUN: 10 mg/dL (ref 6–20)
CO2: 24 mmol/L (ref 22–32)
Calcium: 9.3 mg/dL (ref 8.9–10.3)
Chloride: 103 mmol/L (ref 98–111)
Creatinine, Ser: 0.77 mg/dL (ref 0.44–1.00)
GFR, Estimated: >60 mL/min (ref >60)
Glucose, Bld: 112 mg/dL — ABNORMAL HIGH (ref 70–99)
Potassium: 4.3 mmol/L (ref 3.5–5.1)
Sodium: 137 mmol/L (ref 135–145)

## 2022-01-19 LAB — I-STAT BETA HCG BLOOD, ED (MC, WL, AP ONLY): I-stat hCG, quantitative: <5 m[IU]/mL (ref <5)

## 2022-01-19 NOTE — ED Triage Notes (Signed)
Pt states she was going into her trunk, states that her trunk door closed onto her head. Swelling noted to top R frontal area of head, pt endorses associated dizziness, nausea, blurred vision on R side

## 2022-01-19 NOTE — ED Provider Triage Note (Signed)
Emergency Medicine Provider Triage Evaluation Note  Kathryn Middleton , a 46 y.o. female  was evaluated in triage.  Pt complains of head injury earlier today. She reports that she hit her head on her trunk. No LOC. No blood thinner use.   Review of Systems  Positive:  Negative:   Physical Exam  BP (!) 147/98   Pulse 81   Temp 97.9 F (36.6 C) (Oral)   Resp 14   SpO2 100%  Gen:   Awake, no distress   Resp:  Normal effort  MSK:   Moves extremities without difficulty  Other:  Cranial nerves II through XII grossly intact.  Large soft hematoma noted on the right upper scalp.  No battle signs or raccoon eyes.  Patient answering questions appropriately with appropriate speech.  PERRLA.  Medical Decision Making  Medically screening exam initiated at 3:56 PM.  Appropriate orders placed.  Kathryn Middleton was informed that the remainder of the evaluation will be completed by another provider, this initial triage assessment does not replace that evaluation, and the importance of remaining in the ED until their evaluation is complete.  Discussed with my attending, Dr. Lynelle Doctor, will order CT imaging of head and neck.   Achille Rich, [place] 01/19/22 1557

## 2022-01-20 ENCOUNTER — Other Ambulatory Visit: Payer: Self-pay

## 2022-01-20 ENCOUNTER — Encounter (HOSPITAL_COMMUNITY): Payer: Self-pay

## 2022-01-20 NOTE — ED Provider Notes (Signed)
[facility] EMERGENCY DEPARTMENT Provider Note   CSN: [id number] Arrival date & time: 01/19/22  1419     History  Chief Complaint  Patient presents with   Head Injury   Dizziness    Versie Fehring is a 46 y.o. female.  The history is provided by the patient and the spouse.  Head Injury Pain details:    Quality:  Aching   Severity:  Moderate Chronicity:  New Relieved by:  Nothing Associated symptoms: blurred vision, headache and nausea   Associated symptoms: no double vision, no focal weakness, no loss of consciousness and no vomiting   Dizziness Associated symptoms: headaches and nausea   Associated symptoms: no vomiting   Patient was going into her trunk when the truck door closed on the front part of her head.  She had immediate headache, dizziness nausea and blurred vision.  No LOC.  She is not on anticoagulation. She now reports continued headache and swelling on her scalp    Past Medical History:  Diagnosis Date   Hypertension    Migraines     Home Medications Prior to Admission medications   Medication Sig Start Date End Date Taking? Authorizing Provider  acetaminophen (TYLENOL) 500 MG tablet Take 1,000 mg by mouth every 6 (six) hours as needed for headache.    [provider]  albuterol (VENTOLIN HFA) 108 (90 Base) MCG/ACT inhaler Inhale 2 puffs into the lungs every 6 (six) hours as needed for wheezing or shortness of breath. 06/12/20   Terald Sleeper, MD  amLODipine (NORVASC) 10 MG tablet Take 10 mg by mouth daily. 05/11/20   [provider]  aspirin-acetaminophen-caffeine (EXCEDRIN MIGRAINE) [phone] MG per tablet Take 2 tablets by mouth every 6 (six) hours as needed. For migraine    [provider]  buPROPion (WELLBUTRIN) 75 MG tablet Take 75 mg by mouth 2 (two) times daily.    [provider]  Cholecalciferol 125 MCG (5000 UT) TABS Take by mouth. 06/25/15   [provider]  clonazePAM  (KLONOPIN) 0.5 MG tablet Take 0.5 mg by mouth 2 (two) times daily as needed. 05/18/20   [provider]  cyclobenzaprine (FLEXERIL) 10 MG tablet Take 1 tablet (10 mg total) by mouth 2 (two) times daily as needed for muscle spasms. 07/31/14   Marlon Pel, PA-C  ferrous sulfate 325 (65 FE) MG tablet Take 325 mg by mouth daily with breakfast.    [provider]  HYDROcodone-homatropine (HYCODAN) 5-1.5 MG/5ML syrup Take 5 mLs by mouth every 6 (six) hours as needed for cough. 06/08/20   Lamptey, Britta Mccreedy, MD  liraglutide (VICTOZA) 18 MG/3ML SOPN Inject into the skin.    [provider]  lisinopril-hydrochlorothiazide (PRINZIDE,ZESTORETIC) 20-25 MG per tablet Take 1 tablet by mouth daily.    [provider]  medroxyPROGESTERone (DEPO-SUBQ PROVERA) 104 MG/0.65ML injection Inject 104 mg into the skin every 3 (three) months.    [provider]  metoCLOPramide (REGLAN) 10 MG tablet Take 1 tablet (10 mg total) by mouth every 8 (eight) hours as needed (headache). 06/25/16   Tomasita Crumble, MD  Multiple Vitamin (MULTI-VITAMIN) tablet Take 1 tablet by mouth daily.    [provider]  QUEtiapine (SEROQUEL) 300 MG tablet Take 300 mg by mouth 2 (two) times daily. 05/21/20   [provider]  SUMAtriptan (IMITREX) 100 MG tablet Take 1 tablet (100 mg total) by mouth every 2 (two) hours as needed for migraine or headache. May repeat in 2 hours  if headache persists or recurs. 11/01/13   Riki Sheer, PA-C      Allergies    Patient has no known allergies.    Review of Systems   Review of Systems  Constitutional:  Negative for fever.  Eyes:  Positive for blurred vision. Negative for double vision.  Gastrointestinal:  Positive for nausea. Negative for vomiting.  Neurological:  Positive for dizziness and headaches. Negative for focal weakness and loss of consciousness.    Physical Exam Updated Vital Signs BP (!) 180/113 (BP Location: Right Arm)    Pulse 79   Temp 98.4 F (36.9 C) (Oral)   Resp 18   SpO2 99%  Physical Exam CONSTITUTIONAL: Well developed/well nourished HEAD: Hematoma noted to the scalp, no crepitus EYES: EOMI/PERRL ENMT: Mucous membranes moist no visible trauma NECK: supple no meningeal signs SPINE/BACK: Mild cervical spine tenderness CV: S1/S2 noted, no murmurs/rubs/gallops noted LUNGS: Lungs are clear to auscultation bilaterally, no apparent distress ABDOMEN: soft, nontender NEURO: Pt is awake/alert/appropriate, moves all extremitiesx4.  No facial droop.  GCS 15.  Ambulatory without ataxia EXTREMITIES: pulses normal/equal, full ROM SKIN: warm, color normal PSYCH: no abnormalities of mood noted, alert and oriented to situation  ED Results / Procedures / Treatments   Labs (all labs ordered are listed, but only abnormal results are displayed) Labs Reviewed  BASIC METABOLIC PANEL - Abnormal; Notable for the following components:      Result Value   Glucose, Bld 112 (*)    All other components within normal limits  CBC WITH DIFFERENTIAL/PLATELET - Abnormal; Notable for the following components:   WBC 12.6 (*)    Neutro Abs 9.1 (*)    All other components within normal limits  I-STAT CHEM 8, ED - Abnormal; Notable for the following components:   Glucose, Bld 105 (*)    All other components within normal limits  I-STAT BETA HCG BLOOD, ED ([facility], [facility], [facility] ONLY)    EKG None  Radiology CT Head Wo Contrast  Result Date: 01/19/2022 CLINICAL DATA:  Trauma to head. EXAM: CT HEAD WITHOUT CONTRAST CT CERVICAL SPINE WITHOUT CONTRAST TECHNIQUE: Multidetector CT imaging of the head and cervical spine was performed following the standard protocol without intravenous contrast. Multiplanar CT image reconstructions of the cervical spine were also generated. RADIATION DOSE REDUCTION: This exam was performed according to the departmental dose-optimization program which includes automated exposure control, adjustment of the mA  and/or kV according to patient size and/or use of iterative reconstruction technique. COMPARISON:  12/01/2013 head CT.  No prior cervical spine imaging. FINDINGS: CT HEAD FINDINGS Brain: No mass lesion, hemorrhage, hydrocephalus, acute infarct, intra-axial, or extra-axial fluid collection. Vascular: No hyperdense vessel or unexpected calcification. Skull: Mild right frontal scalp soft tissue swelling on 58/4. Hyperostosis frontalis interna.  No underlying skull fracture. Sinuses/Orbits: Normal imaged portions of the orbits and globes. Clear paranasal sinuses and mastoid air cells. Other: None. CT CERVICAL SPINE FINDINGS Alignment: Spinal visualization through the bottom of T1. From C5 inferiorly are mildly degraded secondary to overlying soft tissues. Straightening and mild reversal of expected cervical lordosis. Maintenance of vertebral body height. Skull base and vertebrae: Skull base intact. Coronal reformats demonstrate a normal C1-C2 articulation. Facets are well-aligned. Soft tissues and spinal canal: No prevertebral soft tissue swelling. Disc levels:  Intervertebral disc height is maintained. Upper chest: No apical pneumothorax. Other: None. IMPRESSION: 1. Right frontal scalp soft tissue swelling, without acute intracranial abnormality. 2. No acute fracture or subluxation in the cervical spine. Nonspecific straightening  and reversal of expected cervical lordosis. Electronically Signed   By: Jeronimo Greaves M.D.   On: 01/19/2022 18:45   CT Cervical Spine Wo Contrast  Result Date: 01/19/2022 CLINICAL DATA:  Trauma to head. EXAM: CT HEAD WITHOUT CONTRAST CT CERVICAL SPINE WITHOUT CONTRAST TECHNIQUE: Multidetector CT imaging of the head and cervical spine was performed following the standard protocol without intravenous contrast. Multiplanar CT image reconstructions of the cervical spine were also generated. RADIATION DOSE REDUCTION: This exam was performed according to the departmental dose-optimization program  which includes automated exposure control, adjustment of the mA and/or kV according to patient size and/or use of iterative reconstruction technique. COMPARISON:  12/01/2013 head CT.  No prior cervical spine imaging. FINDINGS: CT HEAD FINDINGS Brain: No mass lesion, hemorrhage, hydrocephalus, acute infarct, intra-axial, or extra-axial fluid collection. Vascular: No hyperdense vessel or unexpected calcification. Skull: Mild right frontal scalp soft tissue swelling on 58/4. Hyperostosis frontalis interna.  No underlying skull fracture. Sinuses/Orbits: Normal imaged portions of the orbits and globes. Clear paranasal sinuses and mastoid air cells. Other: None. CT CERVICAL SPINE FINDINGS Alignment: Spinal visualization through the bottom of T1. From C5 inferiorly are mildly degraded secondary to overlying soft tissues. Straightening and mild reversal of expected cervical lordosis. Maintenance of vertebral body height. Skull base and vertebrae: Skull base intact. Coronal reformats demonstrate a normal C1-C2 articulation. Facets are well-aligned. Soft tissues and spinal canal: No prevertebral soft tissue swelling. Disc levels:  Intervertebral disc height is maintained. Upper chest: No apical pneumothorax. Other: None. IMPRESSION: 1. Right frontal scalp soft tissue swelling, without acute intracranial abnormality. 2. No acute fracture or subluxation in the cervical spine. Nonspecific straightening and reversal of expected cervical lordosis. Electronically Signed   By: Jeronimo Greaves M.D.   On: 01/19/2022 18:45    Procedures Procedures    Medications Ordered in ED Medications - No data to display  ED Course/ Medical Decision Making/ A&P         Glasgow Coma Scale Score: 15      NEXUS Criteria Score: 1            Medical Decision Making  This patient presents to the ED for concern of head injury, this involves an extensive number of treatment options, and is a complaint that carries with it a high risk of  complications and morbidity.  The differential diagnosis includes but is not limited to subdural hematoma, subarachnoid hemorrhage, skull fracture, concussion  Comorbidities that complicate the patient evaluation: Patient's presentation is complicated by their history of obesity and diabetes  Social Determinants of Health: Patient's  increased stress at work   increases the complexity of managing their presentation  Additional history obtained: Additional history obtained from spouse  Lab Tests: I Ordered, and personally interpreted labs.  The pertinent results include: Labs overall reassuring  Imaging Studies ordered: I ordered imaging studies including CT scan head and C-spine   I independently visualized and interpreted imaging which showed no acute findings on CT head I agree with the radiologist interpretation   Reevaluation: After the interventions noted above, I reevaluated the patient and found that they have :improved  Complexity of problems addressed: Patient's presentation is most consistent with  acute presentation with potential threat to life or bodily function  Disposition: After consideration of the diagnostic results and the patient's response to treatment,  I feel that the patent would benefit from discharge   .           Final Clinical Impression(s) /  ED Diagnoses Final diagnoses:  Concussion without loss of consciousness, initial encounter    Rx / DC Orders ED Discharge Orders     None         Zadie Rhine, MD 01/20/22 [phone]

## 2022-01-20 NOTE — ED Notes (Signed)
All discharge instructions reviewed with patient including follow up care. Patient verbalized understanding and had no other questions. Patient stable and ambulatory at time of discharge.

## 2022-02-26 ENCOUNTER — Ambulatory Visit (HOSPITAL_COMMUNITY)
Admission: RE | Admit: 2022-02-26 | Discharge: 2022-02-26 | Disposition: A | Discharge status: Home / Self Care | Payer: 59 | Source: Ambulatory Visit | Attending: Physician Assistant | Admitting: Physician Assistant

## 2022-02-26 ENCOUNTER — Ambulatory Visit (HOSPITAL_COMMUNITY): Payer: 59

## 2022-02-26 ENCOUNTER — Encounter (HOSPITAL_COMMUNITY): Payer: Self-pay

## 2022-02-26 VITALS — BP 150/97 | Temp 99.0°F | Resp 12 | Wt 330.0 lb

## 2022-02-26 DIAGNOSIS — U071 COVID-19: Secondary | ICD-10-CM | POA: Diagnosis present

## 2022-02-26 DIAGNOSIS — J029 Acute pharyngitis, unspecified: Secondary | ICD-10-CM | POA: Diagnosis not present

## 2022-02-26 DIAGNOSIS — R051 Acute cough: Secondary | ICD-10-CM | POA: Diagnosis present

## 2022-02-26 MED ORDER — MOLNUPIRAVIR EUA 200MG CAPSULE: 4.0000 | ORAL_CAPSULE | Freq: Two times a day (BID) | ORAL | 0 refills | Status: AC

## 2022-02-26 MED ORDER — FLUTICASONE PROPIONATE 50 MCG/ACT NA SUSP: 1.0000 | Freq: Every day | NASAL | 0 refills | Status: AC

## 2022-02-26 MED ORDER — BENZONATATE 100 MG PO CAPS: 100.0000 mg | ORAL_CAPSULE | Freq: Three times a day (TID) | ORAL | 0 refills | Status: DC

## 2022-02-26 NOTE — Discharge Instructions (Signed)
Start molnupiravir as prescribed.  Use Tessalon for cough and Flonase for nasal congestion.  You can continue over-the-counter medications as needed.  Make sure you rest and drink plenty of fluid.  Follow-up with your primary care next week.  If you have any worsening symptoms including chest pain, shortness of breath, fever not responding to medication, nausea/vomiting interfering with oral intake, weakness you need to go to the emergency room immediately.

## 2022-02-26 NOTE — ED Triage Notes (Signed)
Pt son was diagnosed with covid . Pt has been having symptoms that, has started 3 days ago. Pt has a sore throat , cough , nasal congestion , headache , ears popping extreme fatigue, vomiting .

## 2022-02-26 NOTE — ED Provider Notes (Signed)
[facility]    CSN: [id number] Arrival date & time: 02/26/22  1401      History   Chief Complaint Chief Complaint  Patient presents with   Cough    Covid. - Entered by patient   Covid Exposure    HPI Kathryn Middleton is a 46 y.o. female.   Patient presents today with a 3-day history of URI symptoms.  She took an at-home COVID test that was positive yesterday.  Reports nasal congestion, sore throat, cough, headache, fever.  Denies any chest pain, shortness of breath, nausea, vomiting, diarrhea.  She has had COVID in the past and reports this feels similar but is more mild.  She has had COVID-vaccine.  She denies any recent antibiotic or steroid use.  She has been using multiple over-the-counter medications with only temporary relief of symptoms.  She does have several risk factors for more severe disease including hypertension and elevated BMI.  She is interested in antiviral therapy.  She does not smoke.  She is confident that she is not pregnant and has no intention of getting pregnant in in the future.    Past Medical History:  Diagnosis Date   Hypertension    Migraines     There are no problems to display for this patient.   Past Surgical History:  Procedure Laterality Date   CESAREAN SECTION     CESAREAN SECTION      OB History   No obstetric history on file.      Home Medications    Prior to Admission medications   Medication Sig Start Date End Date Taking? Authorizing Provider  benzonatate (TESSALON) 100 MG capsule Take 1 capsule (100 mg total) by mouth every 8 (eight) hours. 02/26/22  Yes Jenney Brester K, PA-C  fluticasone (FLONASE) 50 MCG/ACT nasal spray Place 1 spray into both nostrils daily. 02/26/22  Yes Laymon Stockert K, PA-C  molnupiravir EUA (LAGEVRIO) 200 mg CAPS capsule Take 4 capsules (800 mg total) by mouth 2 (two) times daily for 5 days. 02/26/22 03/03/22 Yes Josefa Syracuse, Derry Skill, PA-C  acetaminophen (TYLENOL) 500 MG tablet Take 1,000 mg by mouth  every 6 (six) hours as needed for headache.    [provider]  albuterol (VENTOLIN HFA) 108 (90 Base) MCG/ACT inhaler Inhale 2 puffs into the lungs every 6 (six) hours as needed for wheezing or shortness of breath. 06/12/20   Wyvonnia Dusky, MD  amLODipine (NORVASC) 10 MG tablet Take 10 mg by mouth daily. 05/11/20   [provider]  aspirin-acetaminophen-caffeine (EXCEDRIN MIGRAINE) [phone] MG per tablet Take 2 tablets by mouth every 6 (six) hours as needed. For migraine    [provider]  buPROPion (WELLBUTRIN) 75 MG tablet Take 75 mg by mouth 2 (two) times daily.    [provider]  Cholecalciferol 125 MCG (5000 UT) TABS Take by mouth. 06/25/15   [provider]  clonazePAM (KLONOPIN) 0.5 MG tablet Take 0.5 mg by mouth 2 (two) times daily as needed. 05/18/20   [provider]  cyclobenzaprine (FLEXERIL) 10 MG tablet Take 1 tablet (10 mg total) by mouth 2 (two) times daily as needed for muscle spasms. 07/31/14   Delos Haring, PA-C  ferrous sulfate 325 (65 FE) MG tablet Take 325 mg by mouth daily with breakfast.    [provider]  liraglutide (VICTOZA) 18 MG/3ML SOPN Inject into the skin.    [provider]  lisinopril-hydrochlorothiazide (PRINZIDE,ZESTORETIC) 20-25 MG per tablet Take 1 tablet by mouth  daily.    [provider]  medroxyPROGESTERone (DEPO-SUBQ PROVERA) 104 MG/0.65ML injection Inject 104 mg into the skin every 3 (three) months.    [provider]  metoCLOPramide (REGLAN) 10 MG tablet Take 1 tablet (10 mg total) by mouth every 8 (eight) hours as needed (headache). 06/25/16   Tomasita Crumble, MD  Multiple Vitamin (MULTI-VITAMIN) tablet Take 1 tablet by mouth daily.    [provider]  QUEtiapine (SEROQUEL) 300 MG tablet Take 300 mg by mouth 2 (two) times daily. 05/21/20   [provider]    Family History Family History  Problem Relation Age of Onset   Diabetes Mother     Hypertension Mother    Diabetes Father    Hypertension Father     Social History Social History   Tobacco Use   Smoking status: Never   Smokeless tobacco: Never  Vaping Use   Vaping Use: Never used  Substance Use Topics   Alcohol use: No   Drug use: No     Allergies   Patient has no known allergies.   Review of Systems Review of Systems  Constitutional:  Positive for activity change and fever. Negative for appetite change and fatigue.  HENT:  Positive for congestion and sore throat. Negative for sinus pressure and sneezing.   Respiratory:  Positive for cough. Negative for shortness of breath.   Cardiovascular:  Negative for chest pain.  Gastrointestinal:  Negative for abdominal pain, diarrhea, nausea and vomiting.  Neurological:  Positive for headaches. Negative for dizziness and light-headedness.     Physical Exam Triage Vital Signs ED Triage Vitals  Enc Vitals Group     BP 02/26/22 1423 (!) 150/97     Pulse --      Resp 02/26/22 1423 12     Temp 02/26/22 1423 99 F (37.2 C)     Temp Source 02/26/22 1423 Oral     SpO2 02/26/22 1423 96 %     Weight 02/26/22 1420 (!) 330 lb (149.7 kg)     Height --      Head Circumference --      Peak Flow --      Pain Score 02/26/22 1420 3     Pain Loc --      Pain Edu? --      Excl. in GC? --    No data found.  Updated Vital Signs BP (!) 150/97 (BP Location: Left Arm)   Temp 99 F (37.2 C) (Oral)   Resp 12   Wt (!) 330 lb (149.7 kg)   SpO2 96%   BMI 44.76 kg/m   Visual Acuity Right Eye Distance:   Left Eye Distance:   Bilateral Distance:    Right Eye Near:   Left Eye Near:    Bilateral Near:     Physical Exam Vitals reviewed.  Constitutional:      General: She is awake. She is not in acute distress.    Appearance: Normal appearance. She is well-developed. She is not ill-appearing.     Comments: Very pleasant female appears stated age no acute distress sitting comfortably in exam room  HENT:     Head:  Normocephalic and atraumatic.     Right Ear: Tympanic membrane, ear canal and external ear normal. Tympanic membrane is not erythematous or bulging.     Left Ear: Tympanic membrane, ear canal and external ear normal. Tympanic membrane is not erythematous or bulging.     Nose:     Right  Sinus: No maxillary sinus tenderness or frontal sinus tenderness.     Left Sinus: No maxillary sinus tenderness or frontal sinus tenderness.     Mouth/Throat:     Pharynx: Uvula midline. No oropharyngeal exudate or posterior oropharyngeal erythema.  Cardiovascular:     Rate and Rhythm: Normal rate and regular rhythm.     Heart sounds: Normal heart sounds, S1 normal and S2 normal. No murmur heard. Pulmonary:     Effort: Pulmonary effort is normal.     Breath sounds: Normal breath sounds. No wheezing, rhonchi or rales.     Comments: Clear to auscultation bilaterally Psychiatric:        Behavior: Behavior is cooperative.      UC Treatments / Results  Labs (all labs ordered are listed, but only abnormal results are displayed) Labs Reviewed  SARS CORONAVIRUS 2 (TAT 6-24 HRS)    EKG   Radiology No results found.  Procedures Procedures (including critical care time)  Medications Ordered in UC Medications - No data to display  Initial Impression / Assessment and Plan / UC Course  I have reviewed the triage vital signs and the nursing notes.  Pertinent labs & imaging results that were available during my care of the patient were reviewed by me and considered in my medical decision making (see chart for details).     Patient is well-appearing, afebrile, nontoxic.  Given she had positive COVID test at home we will treat her for COVID.  She is a candidate for antivirals.  Discussed that based on her medications including Seroquel and clonazepam she would not qualify for Paxlovid.  She then reported that she only takes this as needed and had not had them in approximately 5 days.  We discussed that she  would have to hold those as well as decrease her dose of amlodipine if she is interested in taking Paxlovid but ultimately she decided to take molnupiravir.  Patient requested COVID testing.  She is requesting that we find out what strain she has.  Discussed that this is not something we do in hospital systems and is done by the state lab based on the renal sample of positive results.  She still requested testing which was obtained.  She was provided work excuse note with current CDC return to work guidelines.  She was prescribed Tessalon and Flonase for cough and congestion.  Recommend that she rest and drink plenty of fluid.  If her symptoms are not improving by next week she is to return for reevaluation.  If she has any worsening symptoms she needs to be seen immediately including high fever not respond to medication, chest pain, shortness of breath, worsening cough.  Strict return precautions given.  Work excuse note provided.  Final Clinical Impressions(s) / UC Diagnoses   Final diagnoses:  COVID-19  Sore throat  Acute cough     Discharge Instructions      Start molnupiravir as prescribed.  Use Tessalon for cough and Flonase for nasal congestion.  You can continue over-the-counter medications as needed.  Make sure you rest and drink plenty of fluid.  Follow-up with your primary care next week.  If you have any worsening symptoms including chest pain, shortness of breath, fever not responding to medication, nausea/vomiting interfering with oral intake, weakness you need to go to the emergency room immediately.     ED Prescriptions     Medication Sig Dispense Auth. Provider   molnupiravir EUA (LAGEVRIO) 200 mg CAPS capsule Take 4 capsules (  800 mg total) by mouth 2 (two) times daily for 5 days. 40 capsule Yeira Gulden K, PA-C   fluticasone (FLONASE) 50 MCG/ACT nasal spray Place 1 spray into both nostrils daily. 16 g Sakira Dahmer K, PA-C   benzonatate (TESSALON) 100 MG capsule Take 1  capsule (100 mg total) by mouth every 8 (eight) hours. 21 capsule Tanishka Drolet K, PA-C      PDMP not reviewed this encounter.   Jeani Hawking, PA-C 02/26/22 1506

## 2022-02-27 LAB — SARS CORONAVIRUS 2 (TAT 6-24 HRS): SARS Coronavirus 2: POSITIVE — AB

## 2023-02-19 ENCOUNTER — Ambulatory Visit (HOSPITAL_COMMUNITY)
Admission: EM | Admit: 2023-02-19 | Discharge: 2023-02-19 | Disposition: A | Discharge status: Home / Self Care | Payer: 59 | Attending: Physician Assistant | Admitting: Physician Assistant

## 2023-02-19 ENCOUNTER — Ambulatory Visit (INDEPENDENT_AMBULATORY_CARE_PROVIDER_SITE_OTHER): Payer: 59

## 2023-02-19 ENCOUNTER — Encounter (HOSPITAL_COMMUNITY): Payer: Self-pay | Admitting: Emergency Medicine

## 2023-02-19 ENCOUNTER — Other Ambulatory Visit: Payer: Self-pay

## 2023-02-19 DIAGNOSIS — W19XXXA Unspecified fall, initial encounter: Secondary | ICD-10-CM

## 2023-02-19 DIAGNOSIS — M545 Low back pain, unspecified: Secondary | ICD-10-CM | POA: Diagnosis not present

## 2023-02-19 DIAGNOSIS — S8001XA Contusion of right knee, initial encounter: Secondary | ICD-10-CM | POA: Diagnosis not present

## 2023-02-19 DIAGNOSIS — S8391XA Sprain of unspecified site of right knee, initial encounter: Secondary | ICD-10-CM | POA: Diagnosis not present

## 2023-02-19 HISTORY — DX: Bipolar disorder, unspecified: F31.9

## 2023-02-19 HISTORY — DX: Anemia, unspecified: D64.9

## 2023-02-19 HISTORY — DX: Type 2 diabetes mellitus without complications: E11.9

## 2023-02-19 MED ORDER — IBUPROFEN 800 MG PO TABS: 800.0000 mg | ORAL_TABLET | Freq: Three times a day (TID) | ORAL | 0 refills | Status: AC

## 2023-02-19 MED ORDER — LIDOCAINE 5 % EX PTCH: 1.0000 | MEDICATED_PATCH | CUTANEOUS | 0 refills | Status: AC

## 2023-02-19 MED ORDER — METHOCARBAMOL 500 MG PO TABS: 500.0000 mg | ORAL_TABLET | Freq: Every day | ORAL | 0 refills | Status: DC

## 2023-02-19 NOTE — Discharge Instructions (Addendum)
I do not see any obvious fracture on your knee or lower spine x-rays.  I believe that you have injured the soft tissue.  This will probably take a few weeks before it gets back to normal.  Take ibuprofen for pain relief.  Do not take NSAIDs with this medication due to risk of GI bleeding including aspirin, ibuprofen/Advil, naproxen/Aleve.  I have also called in lidocaine patches for the lower back pain.  Apply this for 12 hours during the day and then remove it for 12 hours at night.  Take vaccine at night.  This can make you sleepy so take it just before you go to bed to avoid falling.  I would like you to follow-up with Ortho for further evaluation and management.  Please call them to schedule an appointment.  Keep your knee elevated and apply ice for 15 minutes at a time several times per day.  Try to avoid strenuous activity including lots of walking.  You can use an Ace bandage for additional pain relief.  If you have any increasing pain, swelling, weakness in your legs, going to the bathroom on yourself without noticing it, numbness or tingling in any of your legs or hands you should be seen immediately.

## 2023-02-19 NOTE — ED Provider Notes (Addendum)
[facility]    CSN: [id number] Arrival date & time: 02/19/23  9528      History   Chief Complaint Chief Complaint  Patient presents with   Fall    HPI Kathryn Middleton is a 47 y.o. female.   Patient presents today accompanied by her family who help provide the majority of history.  Reports that approximately 2 hours ago she was grocery shopping when she leaned forward to pick up a pack of cheese and slipped on some water that she did not noticed that was on the floor.  Her left leg went forward and she fell with the majority of her weight onto her anterior right knee which then caused her to fall backwards hitting her buttocks and back on the ground.  She did not hit her head.  She reports that pain is rated 8 on a 0-10 pain scale, described as throbbing, worse with activity or attempted movement.  She has been able to bear weight since the injury.  Denies any paresthesias or numbness in her lower extremities.  Denies previous injury or surgery involving her knee or back.  She has not tried any over-the-counter medication for symptom management.    Past Medical History:  Diagnosis Date   Anemia    Bipolar 1 disorder (HCC)    Diabetes mellitus without complication (HCC)    Hypertension    Migraines     There are no problems to display for this patient.   Past Surgical History:  Procedure Laterality Date   CESAREAN SECTION     CESAREAN SECTION     GASTRIC BYPASS      OB History   No obstetric history on file.      Home Medications    Prior to Admission medications   Medication Sig Start Date End Date Taking? Authorizing Provider  ibuprofen (ADVIL) 800 MG tablet Take 1 tablet (800 mg total) by mouth 3 (three) times daily. 02/19/23  Yes Lexxus Underhill K, PA-C  lidocaine (LIDODERM) 5 % Place 1 patch onto the skin daily. Remove & Discard patch within 12 hours or as directed by MD 02/19/23  Yes Kinzee Happel, Noberto Retort, PA-C  methocarbamol (ROBAXIN) 500 MG tablet Take 1  tablet (500 mg total) by mouth at bedtime. 02/19/23  Yes Kamara Allan, Noberto Retort, PA-C  acetaminophen (TYLENOL) 500 MG tablet Take 1,000 mg by mouth every 6 (six) hours as needed for headache.    [provider]  albuterol (VENTOLIN HFA) 108 (90 Base) MCG/ACT inhaler Inhale 2 puffs into the lungs every 6 (six) hours as needed for wheezing or shortness of breath. 06/12/20   Terald Sleeper, MD  amLODipine (NORVASC) 10 MG tablet Take 10 mg by mouth daily. 05/11/20   [provider]  aspirin-acetaminophen-caffeine (EXCEDRIN MIGRAINE) 801 868 [id number] MG per tablet Take 2 tablets by mouth every 6 (six) hours as needed. For migraine    [provider]  benzonatate (TESSALON) 100 MG capsule Take 1 capsule (100 mg total) by mouth every 8 (eight) hours. 02/26/22   Jaleeya Mcnelly, Noberto Retort, PA-C  buPROPion (WELLBUTRIN) 75 MG tablet Take 75 mg by mouth 2 (two) times daily.    [provider]  Cholecalciferol 125 MCG (5000 UT) TABS Take by mouth. 06/25/15   [provider]  clonazePAM (KLONOPIN) 0.5 MG tablet Take 0.5 mg by mouth 2 (two) times daily as needed. 05/18/20   [provider]  ferrous sulfate 325 (65 FE) MG tablet Take 325 mg by mouth daily  with breakfast.    [provider]  fluticasone (FLONASE) 50 MCG/ACT nasal spray Place 1 spray into both nostrils daily. 02/26/22   Emeterio Balke K, PA-C  liraglutide (VICTOZA) 18 MG/3ML SOPN Inject into the skin.    [provider]  lisinopril-hydrochlorothiazide (PRINZIDE,ZESTORETIC) 20-25 MG per tablet Take 1 tablet by mouth daily.    [provider]  medroxyPROGESTERone (DEPO-SUBQ PROVERA) 104 MG/0.65ML injection Inject 104 mg into the skin every 3 (three) months.    [provider]  metoCLOPramide (REGLAN) 10 MG tablet Take 1 tablet (10 mg total) by mouth every 8 (eight) hours as needed (headache). 06/25/16   Tomasita Crumble, MD  Multiple Vitamin (MULTI-VITAMIN) tablet Take 1 tablet by mouth daily.     [provider]  QUEtiapine (SEROQUEL) 300 MG tablet Take 300 mg by mouth 2 (two) times daily. 05/21/20   [provider]    Family History Family History  Problem Relation Age of Onset   Diabetes Mother    Hypertension Mother    Diabetes Father    Hypertension Father     Social History Social History   Tobacco Use   Smoking status: Never   Smokeless tobacco: Never  Vaping Use   Vaping status: Never Used  Substance Use Topics   Alcohol use: No   Drug use: No     Allergies   Patient has no known allergies.   Review of Systems Review of Systems  Constitutional:  Positive for activity change. Negative for appetite change, fatigue and fever.  Gastrointestinal:  Negative for abdominal pain, diarrhea, nausea and vomiting.  Musculoskeletal:  Positive for arthralgias and back pain. Negative for myalgias.  Neurological:  Negative for dizziness, weakness, light-headedness, numbness and headaches.     Physical Exam Triage Vital Signs ED Triage Vitals  Encounter Vitals Group     BP 02/19/23 1944 131/85     Systolic BP Percentile --      Diastolic BP Percentile --      Pulse Rate 02/19/23 1944 93     Resp 02/19/23 1944 20     Temp 02/19/23 1944 97.8 F (36.6 C)     Temp Source 02/19/23 1944 Oral     SpO2 02/19/23 1944 95 %     Weight --      Height --      Head Circumference --      Peak Flow --      Pain Score 02/19/23 1940 8     Pain Loc --      Pain Education --      Exclude from Growth Chart --    No data found.  Updated Vital Signs BP 131/85 (BP Location: Right Arm) Comment (BP Location): regular cuff /forearm  Pulse 93   Temp 97.8 F (36.6 C) (Oral)   Resp 20   LMP 02/04/2023   SpO2 95%   Visual Acuity Right Eye Distance:   Left Eye Distance:   Bilateral Distance:    Right Eye Near:   Left Eye Near:    Bilateral Near:     Physical Exam Vitals reviewed.  Constitutional:      General: She is awake. She is not in acute  distress.    Appearance: Normal appearance. She is well-developed. She is not ill-appearing.     Comments: Very pleasant female appears stated age in no acute distress sitting comfortably in wheelchair in exam room.  HENT:     Head: Normocephalic and atraumatic.  Cardiovascular:  Rate and Rhythm: Normal rate and regular rhythm.     Heart sounds: Normal heart sounds, S1 normal and S2 normal. No murmur heard. Pulmonary:     Effort: Pulmonary effort is normal.     Breath sounds: Normal breath sounds. No wheezing, rhonchi or rales.     Comments: Clear to auscultation bilateral Abdominal:     Palpations: Abdomen is soft.     Tenderness: There is no abdominal tenderness.  Musculoskeletal:     Cervical back: No tenderness or bony tenderness.     Thoracic back: Tenderness present. No bony tenderness.     Lumbar back: Tenderness and bony tenderness present. Negative right straight leg raise test and negative left straight leg raise test.     Right knee: No swelling or deformity. Decreased range of motion. Tenderness present over the medial joint line. No LCL laxity, MCL laxity, ACL laxity or PCL laxity.     Instability Tests: Anterior drawer test negative. Posterior drawer test negative.     Comments: Back: Pain percussion of lumbar vertebrae.  No deformity or step-off noted.  Significant tender to palpation of lower thoracic and lumbar paraspinal muscles.  Strength 5/5 bilateral upper and lower extremities.  Right knee: Tender to palpation over inferior joint line.  No deformity noted.  Decreased range of motion with extension and flexion secondary to pain.  Patient unable to tolerate special tests.  Psychiatric:        Behavior: Behavior is cooperative.      UC Treatments / Results  Labs (all labs ordered are listed, but only abnormal results are displayed) Labs Reviewed - No data to display  EKG   Radiology No results found.  Procedures Procedures (including critical care  time)  Medications Ordered in UC Medications - No data to display  Initial Impression / Assessment and Plan / UC Course  I have reviewed the triage vital signs and the nursing notes.  Pertinent labs & imaging results that were available during my care of the patient were reviewed by me and considered in my medical decision making (see chart for details).     Patient is well-appearing, afebrile, nontoxic, nontachycardic.  X-ray of right knee and lumbar spine was obtained given her bony tenderness after recent fall.  Initial review by myself and Dr. Marlinda Mike showed no acute osseous abnormality.  At the time of discharge we were waiting for radiologist over read and we will contact patient if this is abnormal and changes our treatment plan.  She was started on ibuprofen 800 mg for pain and inflammation we discussed that she is not to take NSAIDs with this medication and risk of GI bleeding.  She did think a muscle relaxer may help her pain and so she was provided Robaxin to be taken at night but we discussed that she should take this just before she gets in bed to avoid recurrent falls as this could worsen her injuries.  She can use heat on her lower back as well as RICE for her knee.  She was provided a brace for comfort and support.  Recommend that she follow-up closely with Ortho and she was given contact information for local provider with instruction to call to schedule an appointment.  She is to apply lidocaine patches for additional pain relief and we discussed that this should be applied for 12 hours then remove for 12 hours using only 1 patch per 24 hours.  If she has any worsening or changing symptoms she needs to  be seen immediately.  She was provided work excuse note.  Strict return precautions given.  All questions answered to patient and husband satisfaction.  Final Clinical Impressions(s) / UC Diagnoses   Final diagnoses:  Contusion of right knee, initial encounter  Acute midline low  back pain without sciatica  Fall, initial encounter  Sprain of right knee, unspecified ligament, initial encounter     Discharge Instructions      I do not see any obvious fracture on your knee or lower spine x-rays.  I believe that you have injured the soft tissue.  This will probably take a few weeks before it gets back to normal.  Take ibuprofen for pain relief.  Do not take NSAIDs with this medication due to risk of GI bleeding including aspirin, ibuprofen/Advil, naproxen/Aleve.  I have also called in lidocaine patches for the lower back pain.  Apply this for 12 hours during the day and then remove it for 12 hours at night.  Take vaccine at night.  This can make you sleepy so take it just before you go to bed to avoid falling.  I would like you to follow-up with Ortho for further evaluation and management.  Please call them to schedule an appointment.  Keep your knee elevated and apply ice for 15 minutes at a time several times per day.  Try to avoid strenuous activity including lots of walking.  You can use an Ace bandage for additional pain relief.  If you have any increasing pain, swelling, weakness in your legs, going to the bathroom on yourself without noticing it, numbness or tingling in any of your legs or hands you should be seen immediately.     ED Prescriptions     Medication Sig Dispense Auth. Provider   ibuprofen (ADVIL) 800 MG tablet Take 1 tablet (800 mg total) by mouth 3 (three) times daily. 21 tablet Zulema Pulaski K, PA-C   lidocaine (LIDODERM) 5 % Place 1 patch onto the skin daily. Remove & Discard patch within 12 hours or as directed by MD 30 patch Dezmen Alcock K, PA-C   methocarbamol (ROBAXIN) 500 MG tablet Take 1 tablet (500 mg total) by mouth at bedtime. 10 tablet Wilfred Dayrit, Noberto Retort, PA-C      PDMP not reviewed this encounter.   Jeani Hawking, PA-C 02/19/23 2101    Arminta Gamm, Noberto Retort, PA-C 02/19/23 2101

## 2023-02-19 NOTE — ED Triage Notes (Signed)
Slipped and fell.  Reports going straight down on right knee.  And then fell to her buttocks.  Pain in lower right back.   Has not had any medications since incident.    Incident occurred about 2 hours ago.

## 2023-03-02 ENCOUNTER — Encounter: Payer: Self-pay | Admitting: Physician Assistant

## 2023-03-02 ENCOUNTER — Ambulatory Visit (INDEPENDENT_AMBULATORY_CARE_PROVIDER_SITE_OTHER): Payer: 59 | Admitting: Physician Assistant

## 2023-03-02 DIAGNOSIS — M1711 Unilateral primary osteoarthritis, right knee: Secondary | ICD-10-CM

## 2023-03-02 NOTE — Progress Notes (Signed)
Office Visit Note   Patient: Kathryn Middleton           Date of Birth: [birth year]           MRN: [id number] Visit Date: 03/02/2023              Requested by: Diamantina Providence, FNP [address] [place],  [place] [address] PCP: Diamantina Providence, FNP   Assessment & Plan: Visit Diagnoses:  1. Unilateral primary osteoarthritis, right knee     Plan: Impression is probable exacerbation underlying right knee osteoarthritis versus lateral meniscus tear.  At this point, we have discussed various treatment options to include bracing, physical therapist, MRI.  She would like to proceed with MRI at this point.  She would also like another hinged knee brace.  Unfortunately, the largest size we have does not appropriately fit her however she would like to have this anyway.  She will follow-up with [place] after her MRI.  Call with concerns or questions.  Follow-Up Instructions: Return for f/u after mri.   Orders:  No orders of the defined types were placed in this encounter.  No orders of the defined types were placed in this encounter.     Procedures: No procedures performed   Clinical Data: No additional findings.   Subjective: Chief Complaint  Patient presents with   Right Knee - Follow-up    DOI 02/19/2023    HPI patient is a pleasant 47 year old female who comes in today with right knee pain.  On 02/19/2023, she slipped on water at [employer] causing her to fall to the ground landing on her right knee.  She has had pain to the entire aspect ever since.  This is described as a constant throb worse with walking.  She denies any locking or catching but does note tightness.  She has been wearing a brace, taking ibuprofen and Robaxin with some relief in symptoms.  No previous pain to the right knee.  Review of Systems as detailed in HPI.  All others reviewed and are negative.   Objective: Vital Signs: LMP 02/04/2023   Physical Exam well-developed well-nourished female no  acute distress.  Alert and oriented x 3.  Ortho Exam right knee exam: Range of motion 0 to 60 degrees.  She does have tenderness along the lateral joint line.  No medial joint line tenderness.  She is stable to valgus varus stress.  She is neurovascularly intact distally.  Specialty Comments:  No specialty comments available.  Imaging: X-rays reviewed by me in canopy show no acute fracture.  She does have underlying moderate medial and patellofemoral degenerative changes.   PMFS History: There are no problems to display for this patient.  Past Medical History:  Diagnosis Date   Anemia    Bipolar 1 disorder (HCC)    Diabetes mellitus without complication (HCC)    Hypertension    Migraines     Family History  Problem Relation Age of Onset   Diabetes Mother    Hypertension Mother    Diabetes Father    Hypertension Father     Past Surgical History:  Procedure Laterality Date   CESAREAN SECTION     CESAREAN SECTION     GASTRIC BYPASS     Social History   Occupational History   Not on file  Tobacco Use   Smoking status: Never   Smokeless tobacco: Never  Vaping Use   Vaping status: Never Used  Substance and Sexual Activity  Alcohol use: No   Drug use: No   Sexual activity: Yes    Birth control/protection: None

## 2023-03-02 NOTE — Addendum Note (Signed)
Addended by: Wendi Maya on: 03/02/2023 03:07 PM   Modules accepted: Orders

## 2023-03-21 ENCOUNTER — Other Ambulatory Visit: Payer: Self-pay | Admitting: Nurse Practitioner

## 2023-03-21 DIAGNOSIS — Z1231 Encounter for screening mammogram for malignant neoplasm of breast: Secondary | ICD-10-CM

## 2023-03-23 ENCOUNTER — Other Ambulatory Visit: Payer: 59

## 2023-04-02 ENCOUNTER — Other Ambulatory Visit: Payer: Self-pay

## 2023-04-02 ENCOUNTER — Ambulatory Visit: Payer: 59 | Attending: Nurse Practitioner | Admitting: Physical Therapy

## 2023-04-02 DIAGNOSIS — M6281 Muscle weakness (generalized): Secondary | ICD-10-CM | POA: Diagnosis present

## 2023-04-02 DIAGNOSIS — M25661 Stiffness of right knee, not elsewhere classified: Secondary | ICD-10-CM | POA: Diagnosis present

## 2023-04-02 DIAGNOSIS — M25561 Pain in right knee: Secondary | ICD-10-CM | POA: Insufficient documentation

## 2023-04-02 NOTE — Therapy (Signed)
OUTPATIENT PHYSICAL THERAPY LOWER EXTREMITY EVALUATION   Patient Name: Kathryn Middleton MRN: [id number] DOB:Oct 25, 1975, 47 y.o., female Today's Date: 04/02/2023  END OF SESSION:  PT End of Session - 04/02/23 1351     Visit Number 1    Authorization Type [employer]    PT Start Time 1400    PT Stop Time 1440    PT Time Calculation (min) 40 min    Activity Tolerance Patient tolerated treatment well    Behavior During Therapy WFL for tasks assessed/performed             Past Medical History:  Diagnosis Date   Anemia    Bipolar 1 disorder (HCC)    Diabetes mellitus without complication (HCC)    Hypertension    Migraines    Past Surgical History:  Procedure Laterality Date   CESAREAN SECTION     CESAREAN SECTION     GASTRIC BYPASS     There are no problems to display for this patient.   PCP: Diamantina Providence, FNP   REFERRING PROVIDER: Diamantina Providence, FNP  REFERRING DIAG: [phone] (ICD-10-CM) - Sprain of unspecified site of right knee, initial encounter  THERAPY DIAG:  Acute pain of right knee  Stiffness of right knee, not elsewhere classified  Muscle weakness (generalized)  Rationale for Evaluation and Treatment: Rehabilitation  ONSET DATE: 02/19/23  SUBJECTIVE:   SUBJECTIVE STATEMENT: Pt reports she was walking in [employer] and slipped on water and R knee hit the ground. Pt states it was in so much pain. X-rays showed no breaks. Pt reports she is able to bend her knee some. Pain is improving. Pt states she uses a brace when she's not using a cane. Reports she feels like her medial thigh is like a "pulled muscle." Uses brace and elevates it.   PERTINENT HISTORY: From H&P: On 02/19/2023, she slipped on water at [employer] causing her to fall to the ground landing on her right knee.  PAIN:  Are you having pain? Yes: NPRS scale: 6-7 currently, at worst 7/10 Pain location: R lateral and inferior patellar Pain description: Dull, aching Aggravating factors:  "walking on tip toes to stop the pain", increased weight, has pain at night Relieving factors: cane to relief some pressure, icing, elevating, increased fluid intake, icy hot, muscle relaxer and ibuprofen   PRECAUTIONS: Fall  RED FLAGS: None   WEIGHT BEARING RESTRICTIONS: No  FALLS:  Has patient fallen in last 6 months? Yes. 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  DIAGNOSTIC FINDINGS: Knee x-ray 02/19/23 IMPRESSION: 1. No fracture or dislocation of the right knee. 2. Tricompartmental osteoarthritis.  MRI scheduled 04/23/23  PATIENT SURVEYS:  FOTO 46; predicted 61  COGNITION: Overall cognitive status: Within functional limits for tasks assessed     SENSATION: N/T R inferior patellar  EDEMA:  66 cm on L, 71 cm on R  MUSCLE LENGTH: Hamstrings: Tight R>L Thomas test: Unable to fully assess due to "burning" with knee and hip fully extended in supine  POSTURE: flexed trunk , weight shift left, and R knee  flexed  PALPATION: Hypermobile and increased pain with R lateral>medial patellar glides TTP R lateral and inferior patella, tight/tender in quad and adductors  LOWER EXTREMITY ROM:  Active ROM Right eval Left eval  Hip flexion    Hip extension limited   Hip abduction    Hip adduction    Hip internal rotation    Hip external rotation    Knee flexion A: 65 seated A: 108 seated  Knee extension A: -20 LAQ P: 0 supine A: -10 LAQ P: 0 supine  Ankle dorsiflexion    Ankle plantarflexion    Ankle inversion    Ankle eversion     (Blank rows =  not tested)  LOWER EXTREMITY MMT:  MMT Right eval Left eval  Hip flexion 3-   Hip extension 2+   Hip abduction 2+ Unable to test  Hip adduction    Hip internal rotation    Hip external rotation    Knee flexion 4 5  Knee extension 3+ 4  Ankle dorsiflexion    Ankle plantarflexion     Ankle inversion    Ankle eversion     (Blank rows = not tested)  LOWER EXTREMITY SPECIAL TESTS:  Did not assess  FUNCTIONAL TESTS:  TUG: 20.43 sec (no a/d)  GAIT: Distance walked: 10'x2 Assistive device utilized: assessed with and without quad cane Level of assistance: Modified independence Comments: ambulates with knee and hip flexed, toe touch, step to pattern   TODAY'S TREATMENT:                                                                                                                              DATE: 04/02/23 See HEP below Cues to keep cane on L and for heel strike   PATIENT EDUCATION:  Education details: Exam findings, POC, initial HEP Person educated: Patient Education method: Explanation, Demonstration, and Handouts Education comprehension: verbalized understanding, returned demonstration, and needs further education  HOME EXERCISE PROGRAM: Access Code: [email or web address] URL: [email or web address] Date: 04/02/2023 Prepared by: Vernon Prey April Kirstie Peri  Exercises - Supine Short Arc Quad  - 1 x daily - 7 x weekly - 2 sets - 10 reps - Supine Heel Slide with Strap  - 1 x daily - 7 x weekly - 2 sets - 10 reps - Supine Knee Extension Stretch on Towel Roll  - 5 x daily - 7 x weekly - 1 sets - 2 min hold - Bent Knee Fallouts  - 1 x daily - 7 x weekly - 2 sets - 10 reps - Standing Hip Extension with Counter Support  - 1 x daily - 7 x weekly - 2 sets - 10 reps  ASSESSMENT:  CLINICAL IMPRESSION: Patient is a 47 y.o. F who was seen today for physical therapy evaluation and treatment for R knee pain. PMH significant for fall on R knee in the beginning of September 2024.  Assessment significant for hypermobile patella, weak and limited hip abductors, adductors, and  quad/hip flexors, and general LE weakness (pain inhibited) affecting weight bearing tasks such as standing and walking. Pt will benefit from PT to address these issues for return to baseline function.   OBJECTIVE IMPAIRMENTS: Abnormal gait, decreased activity tolerance, decreased balance, decreased endurance, decreased mobility, difficulty walking, decreased ROM, decreased strength, hypomobility, increased edema, increased fascial restrictions, impaired flexibility, impaired sensation, improper body mechanics, postural dysfunction, and pain.   ACTIVITY LIMITATIONS: carrying, lifting, bending, standing, squatting, stairs, transfers, bed mobility, toileting, hygiene/grooming, and locomotion level  PARTICIPATION LIMITATIONS: meal prep, cleaning, laundry, shopping, community activity, and occupation  PERSONAL FACTORS: Age, Fitness, Past/current experiences, and Time since onset of injury/illness/exacerbation are also affecting patient's functional outcome.   REHAB POTENTIAL: Good  CLINICAL DECISION MAKING: Evolving/moderate complexity  EVALUATION COMPLEXITY: Moderate   GOALS: Goals reviewed with patient? Yes  SHORT TERM GOALS: Target date: 04/30/2023  Pt will be ind with initial HEP Baseline: Goal status: INITIAL  2.  Pt will have improved knee AROM from 10-90 deg Baseline:  Goal status: INITIAL  3.  Pt will be able to stand with full hip and knee extension Baseline:  Goal status: INITIAL   LONG TERM GOALS: Target date: 05/28/2023   Pt will be ind with management and progression of HEP Baseline:  Goal status: INITIAL  2.  Pt will demo L = R knee AROM Baseline:  Goal status: INITIAL  3.  Pt will be able to amb >1000' independently with normal reciprocal pattern Baseline:  Goal status: INITIAL  4.  Pt will have improved TUG to </=13 sec Baseline:  Goal status: INITIAL  5.  Pt  will have improved FOTO to >/=61 Baseline: 43 Goal status: INITIAL  PLAN:  PT FREQUENCY: every other week per pt request  PT DURATION: 8 weeks  PLANNED INTERVENTIONS: 97164- PT Re-evaluation, 97110-Therapeutic exercises, 97530- Therapeutic activity, [id number]- Neuromuscular re-education, 97535- Self Care, 16109- Manual therapy, [id number]- Gait training, [phone]- Aquatic Therapy, 97014- Electrical stimulation (unattended), Patient/Family education, Balance training, Stair training, Taping, Dry Needling, Joint mobilization, Cryotherapy, and Moist heat  PLAN FOR NEXT SESSION: Assess response to HEP. Continue to work on knee ROM and knee strengthening. Stretch hip flexors and strengthen glutes. Work on weightbearing and gait.    Tashawn Greff April Ma L Manette Doto, PT 04/02/2023, 1:53 PM

## 2023-04-12 ENCOUNTER — Ambulatory Visit: Payer: 59 | Admitting: Orthopaedic Surgery

## 2023-04-18 ENCOUNTER — Ambulatory Visit: Payer: 59 | Attending: Nurse Practitioner

## 2023-04-18 DIAGNOSIS — M25661 Stiffness of right knee, not elsewhere classified: Secondary | ICD-10-CM | POA: Diagnosis present

## 2023-04-18 DIAGNOSIS — M6281 Muscle weakness (generalized): Secondary | ICD-10-CM | POA: Insufficient documentation

## 2023-04-18 DIAGNOSIS — M25561 Pain in right knee: Secondary | ICD-10-CM | POA: Insufficient documentation

## 2023-04-18 NOTE — Therapy (Addendum)
 OUTPATIENT PHYSICAL THERAPY TREATMENT NOTE AND DISCHARGE  PHYSICAL THERAPY DISCHARGE SUMMARY  Visits from Start of Care: 2  Current functional level related to goals / functional outcomes: See below   Remaining deficits: See below   Education / Equipment: See below   Patient agrees to discharge. Patient goals were not met. Patient is being discharged due to not returning since the last visit.    Patient Name: Kathryn Middleton MRN: [id number] DOB:[birth year], 47 y.o., female Today's Date: 04/18/2023  END OF SESSION:  PT End of Session - 04/18/23 1731     Visit Number 2    Number of Visits 4    Date for PT Re-Evaluation 05/28/23    Authorization Type [employer]    PT Start Time 1745    PT Stop Time 1826    PT Time Calculation (min) 41 min    Activity Tolerance Patient tolerated treatment well    Behavior During Therapy WFL for tasks assessed/performed              Past Medical History:  Diagnosis Date   Anemia    Bipolar 1 disorder (HCC)    Diabetes mellitus without complication (HCC)    Hypertension    Migraines    Past Surgical History:  Procedure Laterality Date   CESAREAN SECTION     CESAREAN SECTION     GASTRIC BYPASS     There are no problems to display for this patient.   PCP: Diamantina Providence, FNP   REFERRING PROVIDER: Diamantina Providence, FNP  REFERRING DIAG: [id number] (ICD-10-CM) - Sprain of unspecified site of right knee, initial encounter  THERAPY DIAG:  Acute pain of right knee  Stiffness of right knee, not elsewhere classified  Muscle weakness (generalized)  Rationale for Evaluation and Treatment: Rehabilitation  ONSET DATE: 02/19/23  SUBJECTIVE:   SUBJECTIVE STATEMENT: Patient reports continued knee pain rating at a 6/10 and that she occasionally wear a brace that helps her pain.   PERTINENT HISTORY: From H&P: On 02/19/2023, she slipped on water at [employer] causing her to fall to the ground landing on her right knee.  PAIN:   Are you having pain? Yes: NPRS scale: 6-7 currently, at worst 7/10 Pain location: R lateral and inferior patellar Pain description: Dull, aching Aggravating factors: "walking on tip toes to stop the pain", increased weight, has pain at night Relieving factors: cane to relief some pressure, icing, elevating, increased fluid intake, icy hot, muscle relaxer and ibuprofen   PRECAUTIONS: Fall  RED FLAGS: None   WEIGHT BEARING RESTRICTIONS: No  FALLS:  Has patient fallen in last 6 months? Yes. 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  DIAGNOSTIC FINDINGS: Knee x-ray 02/19/23 IMPRESSION: 1. No fracture or dislocation of the right knee. 2. Tricompartmental osteoarthritis.  MRI scheduled 04/23/23  PATIENT SURVEYS:  FOTO 46; predicted 61  COGNITION: Overall cognitive status: Within functional limits for tasks assessed     SENSATION: N/T R inferior patellar  EDEMA:  66 cm on L, 71 cm on R 61 on L, 66 on R cm  MUSCLE LENGTH: Hamstrings: Tight R>L Maisie Fus test: Unable to fully assess due to "burning" with knee and hip fully extended in supine  POSTURE: flexed trunk , weight shift left, and R knee flexed  PALPATION: Hypermobile and increased pain with R lateral>medial patellar glides TTP R lateral and inferior patella, tight/tender in quad and adductors  LOWER EXTREMITY ROM:  Active ROM Right eval Left eval Right 04/18/23  Hip flexion     Hip extension limited    Hip abduction     Hip adduction     Hip internal rotation     Hip external rotation      Knee flexion A: 65 seated A: 108 seated A: 70 seated  Knee extension A: -20 LAQ P: 0 supine A: -10 LAQ P: 0 supine A: -3 supine  Ankle dorsiflexion     Ankle plantarflexion     Ankle inversion     Ankle eversion      (Blank rows = not tested)  LOWER EXTREMITY MMT:  MMT Right eval Left eval  Hip flexion 3-   Hip extension 2+   Hip abduction 2+ Unable to test  Hip adduction    Hip internal rotation    Hip external rotation    Knee flexion 4 5  Knee extension 3+ 4  Ankle dorsiflexion    Ankle plantarflexion     Ankle inversion    Ankle eversion     (Blank rows = not tested)  LOWER EXTREMITY SPECIAL TESTS:  Did not assess  FUNCTIONAL TESTS:  TUG: 20.43 sec (no a/d)  GAIT: Distance walked: 10'x2 Assistive device utilized: assessed with and without quad cane Level of assistance: Modified independence Comments: ambulates with knee and hip flexed, toe touch, step to pattern   TODAY'S TREATMENT:       [facility] Adult PT Treatment:                                                DATE: 04/18/23 Therapeutic Exercise: Nustep level 2 x 5 mins working on flexion ROM LAQ 2x10 Seated heel slides x10 Supine SAQ over bolster x10 Supine heel slides with strap x10 Self Care: Completing HEP, continuing PT, general self care                                                                                                                          DATE: 04/02/23 See HEP below Cues to keep cane on L and for heel strike   PATIENT EDUCATION:  Education details: Exam findings, POC, initial HEP Person educated: Patient Education method: Explanation, Demonstration, and Handouts Education comprehension: verbalized understanding, returned demonstration, and needs further education  HOME EXERCISE PROGRAM: Access Code: [email or web address] URL: [email or web address] Date: 04/02/2023 Prepared by: Vernon Prey April Kirstie Peri  Exercises - Supine Short Arc Quad  - 1 x daily - 7 x  weekly - 2 sets - 10 reps - Supine Heel Slide with Strap  - 1 x daily - 7 x weekly - 2 sets - 10 reps - Supine Knee Extension Stretch on Towel Roll  - 5 x daily - 7 x weekly - 1 sets - 2 min hold - Bent Knee Fallouts  - 1 x daily - 7 x weekly - 2 sets - 10 reps - Standing Hip Extension with Counter Support  - 1 x daily - 7 x weekly - 2 sets - 10 reps  ASSESSMENT:  CLINICAL IMPRESSION: Patient presents to PT reporting continued knee pain and that she has been compliant with her HEP. Her ROM remains limited, particularly in flexion. She is able to actively reach -3 extension when supine, but is unable to achieve extension while walking and uses tip toe stance instead. She is unable to actively lift the RLE and has difficulty with SAQ and is also unable to do active heel slides without assist. She is worried about continuing PT at this time due to needing to care for her father, encouraged patient to continue with home exercises and PT as able. Patient continues to benefit from skilled PT services and should be progressed as able to improve functional independence.    OBJECTIVE IMPAIRMENTS: Abnormal gait, decreased activity tolerance, decreased balance, decreased endurance, decreased mobility, difficulty walking, decreased ROM, decreased strength, hypomobility, increased edema, increased fascial restrictions, impaired flexibility, impaired sensation, improper body mechanics, postural dysfunction, and pain.   ACTIVITY LIMITATIONS: carrying, lifting, bending, standing, squatting, stairs, transfers, bed mobility, toileting, hygiene/grooming, and locomotion level  PARTICIPATION LIMITATIONS: meal prep, cleaning, laundry, shopping, community activity, and occupation  PERSONAL FACTORS: Age, Fitness, Past/current experiences, and Time since onset of injury/illness/exacerbation are also affecting patient's functional outcome.   REHAB POTENTIAL: Good  CLINICAL DECISION MAKING: Evolving/moderate  complexity  EVALUATION COMPLEXITY: Moderate   GOALS: Goals reviewed with patient? Yes  SHORT TERM GOALS: Target date: 04/30/2023  Pt will be ind with initial HEP Baseline: Goal status: INITIAL  2.  Pt will have improved knee AROM from 10-90 deg Baseline:  Goal status: INITIAL  3.  Pt will be able to stand with full hip and knee extension Baseline:  Goal status: INITIAL   LONG TERM GOALS: Target date: 05/28/2023   Pt will be ind with management and progression of HEP Baseline:  Goal status: INITIAL  2.  Pt will demo L = R knee AROM Baseline:  Goal status: INITIAL  3.  Pt will be able to amb >1000' independently with normal reciprocal pattern Baseline:  Goal status: INITIAL  4.  Pt will have improved TUG to </=13 sec Baseline:  Goal status: INITIAL  5.  Pt will have improved FOTO to >/=61 Baseline: 43 Goal status: INITIAL  PLAN:  PT FREQUENCY: every other week per pt request  PT DURATION: 8 weeks  PLANNED INTERVENTIONS: 97164- PT Re-evaluation, 97110-Therapeutic exercises, 97530- Therapeutic activity, [id number]- Neuromuscular re-education, 97535- Self Care, 57322- Manual therapy, [id number]- Gait training, [phone]- Aquatic Therapy, 97014- Electrical stimulation (unattended), Patient/Family education, Balance training, Stair training, Taping, Dry Needling, Joint mobilization, Cryotherapy, and Moist heat  PLAN FOR NEXT SESSION: Assess response to HEP. Continue to work on knee ROM and knee strengthening. Stretch hip flexors and strengthen glutes. Work on weightbearing and gait.    Berta Minor, PTA 04/18/2023, 6:31 PM

## 2023-04-28 ENCOUNTER — Ambulatory Visit
Admission: RE | Admit: 2023-04-28 | Discharge: 2023-04-28 | Disposition: A | Discharge status: Home / Self Care | Payer: 59 | Source: Ambulatory Visit | Attending: Orthopaedic Surgery | Admitting: Orthopaedic Surgery

## 2023-04-28 DIAGNOSIS — M1711 Unilateral primary osteoarthritis, right knee: Secondary | ICD-10-CM

## 2023-05-09 ENCOUNTER — Ambulatory Visit: Payer: 59 | Attending: Nurse Practitioner

## 2023-05-09 ENCOUNTER — Telehealth: Payer: Self-pay

## 2023-05-09 NOTE — Telephone Encounter (Signed)
Patient states "I'm at the emergency room with my mom." She agrees to call us back when she is ready to schedule future appt(s). - MJ

## 2023-08-29 ENCOUNTER — Telehealth: Payer: Self-pay | Admitting: Physician Assistant

## 2023-08-29 NOTE — Telephone Encounter (Signed)
 Received call from patient. Wants to get copy of records. I emailed auth to patient at [email or web address]. pts ph [phone]

## 2023-08-30 ENCOUNTER — Other Ambulatory Visit: Payer: Self-pay | Admitting: Medical Genetics

## 2023-08-30 DIAGNOSIS — Z006 Encounter for examination for normal comparison and control in clinical research program: Secondary | ICD-10-CM

## 2023-09-18 LAB — GENECONNECT MOLECULAR SCREEN: Genetic Analysis Overall Interpretation: NEGATIVE

## 2024-01-11 ENCOUNTER — Other Ambulatory Visit: Payer: Self-pay | Admitting: Nurse Practitioner

## 2024-01-11 DIAGNOSIS — Z1231 Encounter for screening mammogram for malignant neoplasm of breast: Secondary | ICD-10-CM

## 2024-01-28 ENCOUNTER — Encounter (HOSPITAL_COMMUNITY): Payer: Self-pay | Admitting: *Deleted

## 2024-01-28 ENCOUNTER — Other Ambulatory Visit: Payer: Self-pay

## 2024-01-28 ENCOUNTER — Ambulatory Visit (HOSPITAL_COMMUNITY)
Admission: EM | Admit: 2024-01-28 | Discharge: 2024-01-28 | Disposition: A | Discharge status: Home / Self Care | Attending: Family Medicine | Admitting: Family Medicine

## 2024-01-28 DIAGNOSIS — G43C0 Periodic headache syndromes in child or adult, not intractable: Secondary | ICD-10-CM | POA: Diagnosis present

## 2024-01-28 DIAGNOSIS — R55 Syncope and collapse: Secondary | ICD-10-CM | POA: Insufficient documentation

## 2024-01-28 DIAGNOSIS — R42 Dizziness and giddiness: Secondary | ICD-10-CM | POA: Insufficient documentation

## 2024-01-28 LAB — CBC
HCT: 35.1 % — ABNORMAL LOW (ref 36.0–46.0)
Hemoglobin: 11.6 g/dL — ABNORMAL LOW (ref 12.0–15.0)
MCH: 31.8 pg (ref 26.0–34.0)
MCHC: 33 g/dL (ref 30.0–36.0)
MCV: 96.2 fL (ref 80.0–100.0)
Platelets: 306 K/uL (ref 150–400)
RBC: 3.65 MIL/uL — ABNORMAL LOW (ref 3.87–5.11)
RDW: 12.1 % (ref 11.5–15.5)
WBC: 10.4 K/uL (ref 4.0–10.5)
nRBC: 0 % (ref 0.0–0.2)

## 2024-01-28 LAB — BASIC METABOLIC PANEL WITH GFR
Anion gap: 8 (ref 5–15)
BUN: 14 mg/dL (ref 6–20)
CO2: 26 mmol/L (ref 22–32)
Calcium: 9.2 mg/dL (ref 8.9–10.3)
Chloride: 104 mmol/L (ref 98–111)
Creatinine, Ser: 0.85 mg/dL (ref 0.44–1.00)
GFR, Estimated: >60 mL/min (ref >60)
Glucose, Bld: 68 mg/dL — ABNORMAL LOW (ref 70–99)
Potassium: 4.2 mmol/L (ref 3.5–5.1)
Sodium: 138 mmol/L (ref 135–145)

## 2024-01-28 LAB — TSH: TSH: 0.859 u[IU]/mL (ref 0.350–4.500)

## 2024-01-28 LAB — POCT URINE PREGNANCY: Preg Test, Ur: NEGATIVE

## 2024-01-28 LAB — POCT FASTING CBG KUC MANUAL ENTRY: POCT Glucose (KUC): 108 mg/dL — AB (ref 70–99)

## 2024-01-28 MED ORDER — KETOROLAC TROMETHAMINE 30 MG/ML IJ SOLN
30.0000 mg | Freq: Once | INTRAMUSCULAR | Status: AC
Start: 2024-01-28 — End: 2024-01-28
  Administered 2024-01-28: 30 mg via INTRAMUSCULAR

## 2024-01-28 MED ORDER — KETOROLAC TROMETHAMINE 30 MG/ML IJ SOLN
INTRAMUSCULAR | Status: AC
  Filled 2024-01-28: qty 1

## 2024-01-28 NOTE — ED Provider Notes (Signed)
 [facility]    CSN: [id number] Arrival date & time: 01/28/24  1038      History   Chief Complaint Chief Complaint  Patient presents with   Loss of Consciousness    HPI Kathryn Middleton is a 48 y.o. female.    Loss of Consciousness Here for syncope.   This AM at work about 2 hours ago, she got up and felt suddenly dizzy, and saw stars. She fell to the ground and then was hearing people talk to her. Thinks this was brief. Still feels weak and some lightheadedness.  BP at that time was elevated, and her sugar was 112.  Does take ozempic for her DM. Had only eaten  a few raisins and half pack of nabs.   No recent n/v/d. No f/c/URI symptoms recently.  LMP 1 1/2 months ago, thinks maybe in menopause.  Does have a h/a, like her usual migraine, takes vanuatu as needed.  NKDA  Past Medical History:  Diagnosis Date   Anemia    Bipolar 1 disorder (HCC)    Diabetes mellitus without complication (HCC)    Hypertension    Migraines     There are no active problems to display for this patient.   Past Surgical History:  Procedure Laterality Date   CESAREAN SECTION     CESAREAN SECTION     GASTRIC BYPASS      OB History   No obstetric history on file.      Home Medications    Prior to Admission medications   Medication Sig Start Date End Date Taking? Authorizing Provider  amLODipine (NORVASC) 10 MG tablet Take 10 mg by mouth daily. 05/11/20  Yes [provider]  buPROPion (WELLBUTRIN) 75 MG tablet Take 75 mg by mouth 2 (two) times daily.   Yes [provider]  clonazePAM (KLONOPIN) 0.5 MG tablet Take 0.5 mg by mouth 2 (two) times daily as needed. 05/18/20  Yes [provider]  Multiple Vitamin (MULTI-VITAMIN) tablet Take 1 tablet by mouth daily.   Yes [provider]  OZEMPIC, 2 MG/DOSE, 8 MG/3ML SOPN Inject 2 mg every week by subcutaneous route as directed for 30 days. 01/08/24  Yes [provider]  QUEtiapine  (SEROQUEL) 300 MG tablet Take 300 mg by mouth 2 (two) times daily. 05/21/20  Yes [provider]  Cholecalciferol 125 MCG (5000 UT) TABS Take by mouth. 06/25/15   [provider]  ferrous sulfate 325 (65 FE) MG tablet Take 325 mg by mouth daily with breakfast.    [provider]  fluticasone  (FLONASE ) 50 MCG/ACT nasal spray Place 1 spray into both nostrils daily. 02/26/22   Raspet, Erin K, PA-C  ibuprofen  (ADVIL ) 800 MG tablet Take 1 tablet (800 mg total) by mouth 3 (three) times daily. 02/19/23   Raspet, Erin K, PA-C  lidocaine  (LIDODERM ) 5 % Place 1 patch onto the skin daily. Remove & Discard patch within 12 hours or as directed by MD 02/19/23   Raspet, Rocky POUR, PA-C    Family History Family History  Problem Relation Age of Onset   Diabetes Mother    Hypertension Mother    Diabetes Father    Hypertension Father     Social History Social History   Tobacco Use   Smoking status: Never   Smokeless tobacco: Never  Vaping Use   Vaping status: Never Used  Substance Use Topics   Alcohol use: No   Drug use: No     Allergies  Other   Review of Systems Review of Systems  Cardiovascular:  Positive for syncope.     Physical Exam Triage Vital Signs ED Triage Vitals  Encounter Vitals Group     BP 01/28/24 1050 123/77     Girls Systolic BP Percentile --      Girls Diastolic BP Percentile --      Boys Systolic BP Percentile --      Boys Diastolic BP Percentile --      Pulse Rate 01/28/24 1050 85     Resp 01/28/24 1050 16     Temp 01/28/24 1051 98 F (36.7 C)     Temp Source 01/28/24 1051 Oral     SpO2 01/28/24 1050 99 %     Weight --      Height --      Head Circumference --      Peak Flow --      Pain Score 01/28/24 1104 7     Pain Loc --      Pain Education --      Exclude from Growth Chart --    No data found.  Updated Vital Signs BP 123/77   Pulse 85   Temp 98 F (36.7 C) (Oral)   Resp 16   SpO2 99%   Visual Acuity Right Eye  Distance:   Left Eye Distance:   Bilateral Distance:    Right Eye Near:   Left Eye Near:    Bilateral Near:     Physical Exam Vitals reviewed.  Constitutional:      General: She is not in acute distress.    Appearance: She is not toxic-appearing.  HENT:     Nose: Nose normal.     Mouth/Throat:     Mouth: Mucous membranes are moist.     Pharynx: No oropharyngeal exudate or posterior oropharyngeal erythema.  Eyes:     Extraocular Movements: Extraocular movements intact.     Conjunctiva/sclera: Conjunctivae normal.     Pupils: Pupils are equal, round, and reactive to light.  Cardiovascular:     Rate and Rhythm: Normal rate and regular rhythm.     Heart sounds: No murmur heard. Pulmonary:     Effort: Pulmonary effort is normal. No respiratory distress.     Breath sounds: No stridor. No wheezing, rhonchi or rales.  Musculoskeletal:     Cervical back: Neck supple.  Lymphadenopathy:     Cervical: No cervical adenopathy.  Skin:    Capillary Refill: Capillary refill takes less than 2 seconds.     Coloration: Skin is not jaundiced or pale.  Neurological:     General: No focal deficit present.     Mental Status: She is alert and oriented to person, place, and time.  Psychiatric:        Behavior: Behavior normal.      UC Treatments / Results  Labs (all labs ordered are listed, but only abnormal results are displayed) Labs Reviewed  POCT FASTING CBG [facility] MANUAL ENTRY - Abnormal; Notable for the following components:      Result Value   POCT Glucose ([facility]) 108 (*)    All other components within normal limits  CBC  BASIC METABOLIC PANEL WITH GFR  TSH  POCT URINE PREGNANCY    EKG   Radiology No results found.  Procedures Procedures (including critical care time)  Medications Ordered in UC Medications  ketorolac  (TORADOL ) 30 MG/ML injection 30 mg (has no administration in time range)    Initial Impression /  Assessment and Plan / UC Course  I have reviewed the  triage vital signs and the nursing notes.  Pertinent labs & imaging results that were available during my care of the patient were reviewed by me and considered in my medical decision making (see chart for details).     EKG is normal.  Sugar here 108, about 1 hour after syncope spell.  UPT is negative.  CBC and BMP and TSH are drawn today to assess her symptoms. Eating a little here maybe helped how she felt. Toradol  injection is given here for her h/a. Last creatinine normal in our system  We discussed going to the ER, and she declined to do so. If worsens, or not improving, she will go to the ER to be assessed then.  F/U with her PCP.   Final Clinical Impressions(s) / UC Diagnoses   Final diagnoses:  Dizziness  Syncope and collapse  Periodic headache syndrome, not intractable     Discharge Instructions      The EKG was normal.  The sugar here was 108 on the fingerstick  The urine pregnancy test was negative.  We have drawn blood to check your blood counts and electrolytes and kidney function.  Staff will notify you if there is anything significantly abnormal  You have been given a shot of Toradol  30 mg today.  Eat regular meals, and make sure you are getting plenty of fluids in.  Followup with your primary care about these symptoms.  Go to the ER if you worsen in any way      ED Prescriptions   None    PDMP not reviewed this encounter.   Vonna Sharlet POUR, MD 01/28/24 [phone]

## 2024-01-28 NOTE — ED Triage Notes (Addendum)
 Pt states she got up from sitting position at work when she describes syncopal episode, sliding to ground. Event was witnessed, and states likely only lasted a very short period of time. States when she initially woke up, felt like room was spinning. Now c/o ongoing slight lingering dizziness. Reports abrasion to right elbow. C/o left upper back discomfort since episode.

## 2024-01-28 NOTE — Discharge Instructions (Addendum)
 The EKG was normal.  The sugar here was 108 on the fingerstick  The urine pregnancy test was negative.  We have drawn blood to check your blood counts and electrolytes and kidney function.  Staff will notify you if there is anything significantly abnormal  You have been given a shot of Toradol  30 mg today.  Eat regular meals, and make sure you are getting plenty of fluids in.  Followup with your primary care about these symptoms.  Go to the ER if you worsen in any way

## 2024-01-29 ENCOUNTER — Ambulatory Visit (HOSPITAL_COMMUNITY): Payer: Self-pay

## 2024-01-29 NOTE — Telephone Encounter (Signed)
 Please advise patient that her thyroid testing was normal but her CBC revealed anemia.  She does not appear to be iron deficient but iron studies would be recommended to help determine underlying cause of anemia.  Recommend follow-up with PCP for further testing or follow-up with her gynecologist if her periods have been heavy, even if they have been infrequent.

## 2024-04-07 ENCOUNTER — Encounter: Payer: Self-pay | Admitting: Radiology

## 2024-07-10 ENCOUNTER — Encounter (HOSPITAL_COMMUNITY): Payer: Self-pay

## 2024-07-10 ENCOUNTER — Ambulatory Visit (HOSPITAL_COMMUNITY)
Admission: EM | Admit: 2024-07-10 | Discharge: 2024-07-10 | Disposition: A | Discharge status: Home / Self Care | Source: Home / Self Care

## 2024-07-10 DIAGNOSIS — R051 Acute cough: Secondary | ICD-10-CM

## 2024-07-10 DIAGNOSIS — J4521 Mild intermittent asthma with (acute) exacerbation: Secondary | ICD-10-CM

## 2024-07-10 LAB — POC SOFIA SARS ANTIGEN FIA: SARS Coronavirus 2 Ag: NEGATIVE

## 2024-07-10 MED ORDER — DOXYCYCLINE HYCLATE 100 MG PO CAPS: 100.0000 mg | ORAL_CAPSULE | Freq: Two times a day (BID) | ORAL | 0 refills | Status: AC

## 2024-07-10 MED ORDER — ALBUTEROL SULFATE (2.5 MG/3ML) 0.083% IN NEBU
2.5000 mg | INHALATION_SOLUTION | Freq: Once | RESPIRATORY_TRACT | Status: AC
  Administered 2024-07-10: 2.5 mg via RESPIRATORY_TRACT

## 2024-07-10 MED ORDER — ALBUTEROL SULFATE (2.5 MG/3ML) 0.083% IN NEBU
INHALATION_SOLUTION | RESPIRATORY_TRACT | Status: AC
  Filled 2024-07-10: qty 3

## 2024-07-10 MED ORDER — BENZONATATE 100 MG PO CAPS: 100.0000 mg | ORAL_CAPSULE | Freq: Three times a day (TID) | ORAL | 0 refills | Status: AC | PRN

## 2024-07-10 MED ORDER — ALBUTEROL SULFATE HFA 108 (90 BASE) MCG/ACT IN AERS: 1.0000 | INHALATION_SPRAY | Freq: Four times a day (QID) | RESPIRATORY_TRACT | 0 refills | Status: AC | PRN

## 2024-07-10 NOTE — ED Triage Notes (Signed)
 Pt states she has a head cold and chest congestion for the past 3 days.  States she has been taking OTC cold medicine and an inhaler at home.

## 2024-07-10 NOTE — Discharge Instructions (Signed)
 You have a viral upper respiratory infection.  COVID-19 test is negative.  Symptoms should improve over the next week to 10 days.  If you develop chest pain or shortness of breath, go to the emergency room.  If symptoms are not improved significantly by 07/14/2024, start the doxycycline  and take as prescribed.  Some things that can make you feel better are: - Increased rest - Increasing fluid with water/sugar free electrolytes - Acetaminophen  as needed for fever/pain - Salt water gargling, chloraseptic spray and throat lozenges - OTC guaifenesin  (Mucinex ) 600 mg twice daily for congestion - Saline sinus flushes or a neti pot - Humidifying the air -Tessalon  Perles every 8 hours as needed for dry cough

## 2024-07-10 NOTE — ED Provider Notes (Signed)
 " [facility]    CSN: [id number] Arrival date & time: 07/10/24  1150      History   Chief Complaint Chief Complaint  Patient presents with   Nasal Congestion    HPI Kathryn Middleton is a 49 y.o. female.   Patient presents today with 3 to 4-day history of head cold.  She endorses fever, Tmax 101 F, congested nonproductive cough, shortness of breath that improves with rescue inhaler, stuffy nose, headache, ear pressure, and fatigue.  Patient also reports she is under a lot of stress at night.  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  Patient denies chest pain, runny nose, sore throat, ear pain, abdominal pain, nausea/vomiting, and diarrhea.  She has been taking over-the-counter Mucinex  and other over-the-counter medications without much improvement.    Past Medical History:  Diagnosis Date   Anemia    Bipolar 1 disorder (HCC)    Diabetes mellitus without complication (HCC)    Hypertension    Migraines     There are no active problems to display for this patient.   Past Surgical History:  Procedure Laterality Date   CESAREAN SECTION     CESAREAN SECTION     GASTRIC BYPASS      OB History   No obstetric history on file.      Home Medications    Prior to Admission medications  Medication Sig Start Date End Date Taking? Authorizing Provider  albuterol  (VENTOLIN  HFA) 108 (90 Base) MCG/ACT inhaler Inhale 1-2 puffs into the lungs every 6 (six) hours as needed for wheezing or shortness of breath. 07/10/24  Yes Chandra Harlene LABOR, NP  benzonatate  (TESSALON ) 100 MG capsule Take 1 capsule (100 mg total) by mouth 3 (three) times daily as needed for cough. Do not take with alcohol or while operating or driving heavy machinery 12/07/71  Yes Chandra Harlene A, NP  doxycycline  (VIBRAMYCIN ) 100 MG capsule Take 1 capsule (100 mg total) by mouth 2 (two) times daily for 7 days.  07/10/24 07/17/24 Yes Chandra Harlene A, NP  amLODipine (NORVASC) 10 MG tablet Take 10 mg by mouth daily. 05/11/20   [provider]  buPROPion (WELLBUTRIN) 75 MG tablet Take 75 mg by mouth 2 (two) times daily.    [provider]  Cholecalciferol 125 MCG (5000 UT) TABS Take by mouth. 06/25/15   [provider]  clonazePAM (KLONOPIN) 0.5 MG tablet Take 0.5 mg by mouth 2 (two) times daily as needed. 05/18/20   [provider]  ferrous sulfate 325 (65 FE) MG tablet Take 325 mg by mouth daily with breakfast.    [provider]  fluticasone  (FLONASE ) 50 MCG/ACT nasal spray Place 1 spray into both nostrils daily. 02/26/22   Raspet, Erin K, PA-C  ibuprofen  (ADVIL ) 800 MG tablet Take 1 tablet (800 mg total) by mouth 3 (three) times daily. 02/19/23   Raspet, Erin K, PA-C  lidocaine  (LIDODERM ) 5 % Place 1 patch onto the skin daily. Remove & Discard patch within 12 hours or as directed by MD 02/19/23   Raspet, Erin K, PA-C  Multiple Vitamin (MULTI-VITAMIN) tablet Take 1 tablet by mouth daily.    [provider]  OZEMPIC, 2 MG/DOSE, 8 MG/3ML SOPN Inject 2 mg every week by subcutaneous route as directed for 30 days. 01/08/24   [provider]  QUEtiapine (SEROQUEL) 300 MG tablet Take 300 mg  by mouth 2 (two) times daily. 05/21/20   [provider]    Family History Family History  Problem Relation Age of Onset   Diabetes Mother    Hypertension Mother    Diabetes Father    Hypertension Father     Social History Social History[1]   Allergies   Other   Review of Systems Review of Systems Per HPI  Physical Exam Triage Vital Signs ED Triage Vitals  Encounter Vitals Group     BP 07/10/24 1214 132/89     Girls Systolic BP Percentile --      Girls Diastolic BP Percentile --      Boys Systolic BP Percentile --      Boys Diastolic BP Percentile --      Pulse Rate 07/10/24 1214 80     Resp 07/10/24 1214 16     Temp 07/10/24 1214 (!)  97.5 F (36.4 C)     Temp Source 07/10/24 1214 Oral     SpO2 07/10/24 1214 96 %     Weight --      Height --      Head Circumference --      Peak Flow --      Pain Score 07/10/24 1213 0     Pain Loc --      Pain Education --      Exclude from Growth Chart --    No data found.  Updated Vital Signs BP 132/89 (BP Location: Right Arm)   Pulse 80   Temp (!) 97.5 F (36.4 C) (Oral)   Resp 16   SpO2 96%   Visual Acuity Right Eye Distance:   Left Eye Distance:   Bilateral Distance:    Right Eye Near:   Left Eye Near:    Bilateral Near:     Physical Exam Vitals and nursing note reviewed.  Constitutional:      General: She is not in acute distress.    Appearance: Normal appearance. She is not ill-appearing or toxic-appearing.  HENT:     Head: Normocephalic and atraumatic.     Right Ear: Ear canal and external ear normal. A middle ear effusion is present.     Left Ear: Ear canal and external ear normal. A middle ear effusion is present.     Nose: No congestion or rhinorrhea.     Mouth/Throat:     Mouth: Mucous membranes are moist.     Pharynx: Oropharynx is clear. No oropharyngeal exudate or posterior oropharyngeal erythema.  Eyes:     General: No scleral icterus.    Extraocular Movements: Extraocular movements intact.  Cardiovascular:     Rate and Rhythm: Normal rate and regular rhythm.  Pulmonary:     Effort: Pulmonary effort is normal. No respiratory distress.     Breath sounds: Normal breath sounds. Decreased air movement present. No wheezing.  Musculoskeletal:     Cervical back: Normal range of motion and neck supple.  Lymphadenopathy:     Cervical: No cervical adenopathy.  Skin:    General: Skin is warm and dry.     Coloration: Skin is not jaundiced or pale.     Findings: No erythema or rash.  Neurological:     Mental Status: She is alert and oriented to person, place, and time.  Psychiatric:        Behavior: Behavior is cooperative.      UC Treatments  / Results  Labs (all labs ordered are listed, but only abnormal results  are displayed) Labs Reviewed  POC SOFIA SARS ANTIGEN FIA    EKG   Radiology No results found.  Procedures Procedures (including critical care time)  Medications Ordered in UC Medications  albuterol  (PROVENTIL ) (2.5 MG/3ML) 0.083% nebulizer solution 2.5 mg (2.5 mg Nebulization Given 07/10/24 1249)    Initial Impression / Assessment and Plan / UC Course  I have reviewed the triage vital signs and the nursing notes.  Pertinent labs & imaging results that were available during my care of the patient were reviewed by me and considered in my medical decision making (see chart for details).   Patient is a well-appearing 49 year old female presenting today for head cold and chest congestion.  Vital signs are stable and overall examination is reassuring.  Lung sounds are decreased to auscultation bilaterally, albuterol  breathing treatment given with improvement in air movement bilaterally.  No wheezing, rhonchi, or rales.  Suspect asthma exacerbation due to viral illness.  Discussed with patient.  Patient specifically requesting antibiotic.  We discussed no need for antibiotic at this time, if symptoms persist or worsen, can start doxycycline  early next week and prescription sent to pharmacy.  In meantime, continue guaifenesin , start cough suppressant medication, albuterol  inhaler scheduled every 4-6 hours for 48 hours, then as needed.  Work excuse provided.  The patient was given the opportunity to ask questions.  All questions answered to their satisfaction.  The patient is in agreement to this plan.   Final Clinical Impressions(s) / UC Diagnoses   Final diagnoses:  Acute cough  Mild intermittent asthma with acute exacerbation     Discharge Instructions      You have a viral upper respiratory infection.  COVID-19 test is negative.  Symptoms should improve over the next week to 10 days.  If you develop chest pain or  shortness of breath, go to the emergency room.  If symptoms are not improved significantly by 07/14/2024, start the doxycycline  and take as prescribed.  Some things that can make you feel better are: - Increased rest - Increasing fluid with water/sugar free electrolytes - Acetaminophen  as needed for fever/pain - Salt water gargling, chloraseptic spray and throat lozenges - OTC guaifenesin  (Mucinex ) 600 mg twice daily for congestion - Saline sinus flushes or a neti pot - Humidifying the air -Tessalon  Perles every 8 hours as needed for dry cough      ED Prescriptions     Medication Sig Dispense Auth. Provider   albuterol  (VENTOLIN  HFA) 108 (90 Base) MCG/ACT inhaler Inhale 1-2 puffs into the lungs every 6 (six) hours as needed for wheezing or shortness of breath. 6.7 g Chandra Raisin A, NP   benzonatate  (TESSALON ) 100 MG capsule Take 1 capsule (100 mg total) by mouth 3 (three) times daily as needed for cough. Do not take with alcohol or while operating or driving heavy machinery 21 capsule Chandra Raisin A, NP   doxycycline  (VIBRAMYCIN ) 100 MG capsule Take 1 capsule (100 mg total) by mouth 2 (two) times daily for 7 days. 14 capsule Chandra Raisin LABOR, NP      PDMP not reviewed this encounter.    [1]  Social History Tobacco Use   Smoking status: Never   Smokeless tobacco: Never  Vaping Use   Vaping status: Never Used  Substance Use Topics   Alcohol use: No   Drug use: No     Chandra Raisin LABOR, NP 07/10/24 1528  "
